# Patient Record
Sex: Male | Born: 1980 | Race: White | Hispanic: No | Smoking: Former smoker
Health system: Southern US, Community
[De-identification: ages and names within clinical notes are randomized; demographics above are authoritative.]

## PROBLEM LIST (undated history)

## (undated) DIAGNOSIS — F329 Major depressive disorder, single episode, unspecified: Secondary | ICD-10-CM

## (undated) DIAGNOSIS — F32A Depression, unspecified: Secondary | ICD-10-CM

## (undated) DIAGNOSIS — T7840XA Allergy, unspecified, initial encounter: Secondary | ICD-10-CM

## (undated) HISTORY — PX: HERNIA REPAIR: SHX51

## (undated) HISTORY — DX: Depression, unspecified: F32.A

## (undated) HISTORY — DX: Major depressive disorder, single episode, unspecified: F32.9

## (undated) HISTORY — PX: RHINOPLASTY: SUR1284

## (undated) HISTORY — DX: Allergy, unspecified, initial encounter: T78.40XA

---

## 2006-11-29 ENCOUNTER — Ambulatory Visit: Payer: Self-pay | Admitting: Family Medicine

## 2006-11-29 DIAGNOSIS — F3289 Other specified depressive episodes: Secondary | ICD-10-CM | POA: Insufficient documentation

## 2006-11-29 DIAGNOSIS — F329 Major depressive disorder, single episode, unspecified: Secondary | ICD-10-CM | POA: Insufficient documentation

## 2006-12-27 ENCOUNTER — Ambulatory Visit: Payer: Self-pay | Admitting: Family Medicine

## 2007-01-04 ENCOUNTER — Encounter: Payer: Self-pay | Admitting: Family Medicine

## 2007-01-04 LAB — CONVERTED CEMR LAB
Albumin: 4.8 g/dL (ref 3.5–5.2)
BUN: 16 mg/dL (ref 6–23)
Calcium: 9.3 mg/dL (ref 8.4–10.5)
Chloride: 104 meq/L (ref 96–112)
Glucose, Bld: 96 mg/dL (ref 70–99)
HDL: 48 mg/dL (ref >39)
Potassium: 4 meq/L (ref 3.5–5.3)
Triglycerides: 126 mg/dL (ref <150)

## 2007-01-05 ENCOUNTER — Encounter: Payer: Self-pay | Admitting: Family Medicine

## 2007-05-27 ENCOUNTER — Ambulatory Visit: Payer: Self-pay | Admitting: Family Medicine

## 2007-09-20 ENCOUNTER — Ambulatory Visit: Payer: Self-pay | Admitting: Family Medicine

## 2007-09-20 DIAGNOSIS — G4700 Insomnia, unspecified: Secondary | ICD-10-CM | POA: Insufficient documentation

## 2007-10-12 ENCOUNTER — Telehealth: Payer: Self-pay | Admitting: Family Medicine

## 2007-10-12 ENCOUNTER — Ambulatory Visit: Payer: Self-pay | Admitting: Family Medicine

## 2007-10-12 ENCOUNTER — Encounter: Admission: RE | Admit: 2007-10-12 | Discharge: 2007-10-12 | Payer: Self-pay | Admitting: Family Medicine

## 2007-10-12 DIAGNOSIS — M25539 Pain in unspecified wrist: Secondary | ICD-10-CM

## 2007-12-02 ENCOUNTER — Ambulatory Visit: Payer: Self-pay | Admitting: Family Medicine

## 2007-12-02 DIAGNOSIS — F4322 Adjustment disorder with anxiety: Secondary | ICD-10-CM

## 2008-01-11 ENCOUNTER — Ambulatory Visit: Payer: Self-pay | Admitting: Family Medicine

## 2008-01-11 DIAGNOSIS — J45909 Unspecified asthma, uncomplicated: Secondary | ICD-10-CM | POA: Insufficient documentation

## 2008-01-11 DIAGNOSIS — J069 Acute upper respiratory infection, unspecified: Secondary | ICD-10-CM | POA: Insufficient documentation

## 2008-02-23 ENCOUNTER — Ambulatory Visit: Payer: Self-pay | Admitting: Family Medicine

## 2008-02-23 DIAGNOSIS — K5289 Other specified noninfective gastroenteritis and colitis: Secondary | ICD-10-CM | POA: Insufficient documentation

## 2008-06-22 ENCOUNTER — Ambulatory Visit: Payer: Self-pay | Admitting: Family Medicine

## 2008-06-22 DIAGNOSIS — R5383 Other fatigue: Secondary | ICD-10-CM

## 2008-06-22 DIAGNOSIS — R5381 Other malaise: Secondary | ICD-10-CM

## 2008-06-26 LAB — CONVERTED CEMR LAB
Basophils Absolute: 0 10*3/uL (ref 0.0–0.1)
Eosinophils Absolute: 0 10*3/uL (ref 0.0–0.7)
Eosinophils Relative: 0 % (ref 0–5)
HCT: 42.9 % (ref 39.0–52.0)
Lymphs Abs: 1.6 10*3/uL (ref 0.7–4.0)
MCV: 89.7 fL (ref 78.0–100.0)
Platelets: 203 10*3/uL (ref 150–400)
RDW: 13.4 % (ref 11.5–15.5)

## 2008-08-02 ENCOUNTER — Ambulatory Visit: Payer: Self-pay | Admitting: Family Medicine

## 2008-08-02 ENCOUNTER — Encounter: Admission: RE | Admit: 2008-08-02 | Discharge: 2008-08-02 | Payer: Self-pay | Admitting: Family Medicine

## 2008-08-02 DIAGNOSIS — L608 Other nail disorders: Secondary | ICD-10-CM

## 2008-08-02 DIAGNOSIS — M79609 Pain in unspecified limb: Secondary | ICD-10-CM | POA: Insufficient documentation

## 2008-08-05 ENCOUNTER — Telehealth: Payer: Self-pay | Admitting: Family Medicine

## 2008-09-07 ENCOUNTER — Ambulatory Visit: Payer: Self-pay | Admitting: Family Medicine

## 2008-09-07 DIAGNOSIS — K469 Unspecified abdominal hernia without obstruction or gangrene: Secondary | ICD-10-CM | POA: Insufficient documentation

## 2008-09-11 ENCOUNTER — Ambulatory Visit: Payer: Self-pay | Admitting: Family Medicine

## 2008-09-11 DIAGNOSIS — M531 Cervicobrachial syndrome: Secondary | ICD-10-CM

## 2008-09-12 ENCOUNTER — Telehealth: Payer: Self-pay | Admitting: Family Medicine

## 2008-09-18 ENCOUNTER — Encounter: Payer: Self-pay | Admitting: Family Medicine

## 2008-10-24 ENCOUNTER — Ambulatory Visit: Payer: Self-pay | Admitting: Family Medicine

## 2008-10-24 DIAGNOSIS — J019 Acute sinusitis, unspecified: Secondary | ICD-10-CM

## 2008-10-29 ENCOUNTER — Encounter: Payer: Self-pay | Admitting: Family Medicine

## 2008-11-21 ENCOUNTER — Encounter: Payer: Self-pay | Admitting: Family Medicine

## 2009-02-04 ENCOUNTER — Ambulatory Visit: Payer: Self-pay | Admitting: Family Medicine

## 2009-04-25 ENCOUNTER — Encounter: Payer: Self-pay | Admitting: Family Medicine

## 2009-05-09 ENCOUNTER — Ambulatory Visit: Payer: Self-pay | Admitting: Family Medicine

## 2009-12-17 ENCOUNTER — Ambulatory Visit: Payer: Self-pay | Admitting: Family Medicine

## 2010-02-12 ENCOUNTER — Ambulatory Visit: Payer: Self-pay | Admitting: Family Medicine

## 2010-02-12 DIAGNOSIS — M65849 Other synovitis and tenosynovitis, unspecified hand: Secondary | ICD-10-CM

## 2010-02-12 DIAGNOSIS — M65839 Other synovitis and tenosynovitis, unspecified forearm: Secondary | ICD-10-CM

## 2010-02-27 ENCOUNTER — Encounter: Payer: Self-pay | Admitting: Family Medicine

## 2010-04-15 NOTE — Letter (Signed)
Summary: [facility]   Imported By: Lanelle Bal 05/16/2009 10:29:27  _____________________________________________________________________  External Attachment:    Type:   Image     Comment:   External Document

## 2010-04-15 NOTE — Assessment & Plan Note (Signed)
Summary: gastroenteritis   Vital Signs:  Patient profile:   30 year old male Height:      66.25 inches Weight:      140 pounds BMI:     22.51 O2 Sat:      96 % on Room air Pulse rate:   78 / minute BP sitting:   122 / 65  (left arm) Cuff size:   regular  Vitals Entered By: Payton Spark CMA (February 12, 2010 2:55 PM)  O2 Flow:  Room air CC: Weak and diarrhea x 3 days. Also c/o B wrist pain due to job   Primary Care Provider:  Seymour Bars DO  CC:  Weak and diarrhea x 3 days. Also c/o B wrist pain due to job.  History of Present Illness: 30 yo WM presents for GI upset that started Sat night.  He had N/V/ abd pain Sat night and all day Sunday with a low grade fever.  He did not go back to work on Monday.    He still has some lose stools but only 1-2 x a day.  He has not had any more cramping or pain today.  No longer having fevers.  Did not have blood in his stool.  No one else got sick that night.  he had eaten chicken at [employer].  He took some Pepto this AM.  He is making himself drink more today.  Drinking water today.  Still feels a bit  weak.  No longer having N/V.    Current Medications (verified): 1)  Zolpidem Tartrate 5 Mg Tabs (Zolpidem Tartrate) .Marland Kitchen.. 1 Tab By Mouth At Bedtime As Needed Sleep  Allergies (verified): No Known Drug Allergies  Past History:  Past Medical History: Reviewed history from 12/17/2009 and no changes required. depression since 2005 mild intermitent asthma allergies  Social History: Reviewed history from 11/29/2006 and no changes required. 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 Never smoked.  Denies drug use. 1 ETOH per month. Fair diet and rides bike 2 x a wk.  Review of Systems      See HPI  Physical Exam  General:  alert, well-developed, well-nourished, and well-hydrated.   Head:  normocephalic and atraumatic.   Eyes:  sclera non icteric Mouth:  good dentition and pharynx  pink and moist.   Neck:  no masses.   Lungs:  Normal respiratory effort, chest expands symmetrically. Lungs are clear to auscultation, no crackles or wheezes. Heart:  Normal rate and regular rhythm. S1 and S2 normal without gallop, murmur, click, rub or other extra sounds. Abdomen:  soft, non-tender, normal bowel sounds, no distention, no masses, no guarding, no hepatomegaly, and no splenomegaly.   Msk:  tender over the L 1st MCP joint , no effusion, full wrist ROM Skin:  color normal.  no pallor or jaundice Cervical Nodes:  No lymphadenopathy noted   Impression & Recommendations:  Problem # 1:  GASTROENTERITIS (ICD-558.9) Assessment Improved Acute Viral gastroenteritis, resolving after 4 days.  Weakness due to mild dehydration.  Recommend rehydration with diluted gatorade, avoidance of milk products, Kaopectate for diarrhea and Zofran for nausea.  If not completely resolved by Monday, please call. His updated medication list for this problem includes:    Zofran Odt 8 Mg Tbdp (Ondansetron) .Marland Kitchen... 1 tab by mouth sl q 8 hrs as needed nausea  Problem # 2:  TENDINITIS, WRIST (ICD-727.05) Seconday to recurrent lifting at work.  He has been using  an OTC brace but it's not helping.  I did recommend seeing ortho given his occupation.  Complete Medication List: 1)  Zolpidem Tartrate 5 Mg Tabs (Zolpidem tartrate) .Marland Kitchen.. 1 tab by mouth at bedtime as needed sleep 2)  Zofran Odt 8 Mg Tbdp (Ondansetron) .Marland Kitchen.. 1 tab by mouth sl q 8 hrs as needed nausea  Patient Instructions: 1)  Drink diluted gatorade to rehydrate today and tomorrow. 2)  Avoid any milk products for a full week. 3)  Zofran RX given for nausea to use as needed. 4)  Call when you are ready to see sports med for wrist tendonitis. Prescriptions: ZOFRAN ODT 8 MG TBDP (ONDANSETRON) 1 tab by mouth SL q 8 hrs as needed nausea  #15 x 0   Entered and Authorized by:   Seymour Bars DO   Signed by:   Seymour Bars DO on 02/12/2010   Method used:    Electronically to        [facility] [phone]* (retail)       [address]       [place], [place]  [address]       Ph: [phone]       Fax: [phone]   RxID:   [id number]    Orders Added: 1)  Est. Patient Level III [36644]

## 2010-04-15 NOTE — Assessment & Plan Note (Signed)
Summary: insomnia   Vital Signs:  Patient profile:   30 year old male Height:      66.25 inches Weight:      143 pounds BMI:     22.99 O2 Sat:      96 % on Room air Pulse rate:   61 / minute BP sitting:   136 / 64  (left arm) Cuff size:   regular  Vitals Entered By: Payton Spark CMA (December 17, 2009 2:36 PM)  O2 Flow:  Room air CC: Still not sleeping well- hydroxyzine did not work well. Also c/o congestion and drainage x 4 days.   Primary Care Provider:  Seymour Bars DO  CC:  Still not sleeping well- hydroxyzine did not work well. Also c/o congestion and drainage x 4 days.Marland Kitchen  History of Present Illness: 30 yo WM presents for continued problems with poor sleep.   I tried him on Hydroxyzine earlier this year that did work.  He switched to Benadryl after that.  He has a hard time sleeping 2-3 x a wk.  His trouble is falling asleep.  Denies any acute stressors or racing thoughts.  He is trying to unwind before bed.  He drinks caffeine day and night.  He has not been exercising as much.  He has been busy at work.  Denies any further problems with depression or anxiety.  Mountain biking for fun.        Current Medications (verified): 1)  None  Allergies (verified): No Known Drug Allergies  Past History:  Past Medical History: depression since 2005 mild intermitent asthma allergies  Past Surgical History: Reviewed history from 05/09/2009 and no changes required. rhinoplasty x 2 bilat hernia surgery Dr Gerrit Friends 2010-2011  Family History: Reviewed history from 11/29/2006 and no changes required. mother and father healthy sister depression grandmother bipolar d/o  Social History: Reviewed history from 11/29/2006 and no changes required. 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 Never smoked.  Denies drug use. 1 ETOH per month. Fair diet and rides bike 2 x a wk.  Review of Systems      See HPI  Physical  Exam  General:  alert, well-developed, well-nourished, and well-hydrated.   Head:  normocephalic and atraumatic.   Mouth:  pharynx pink and moist.  cobblestoning Neck:  no masses.   Lungs:  Normal respiratory effort, chest expands symmetrically. Lungs are clear to auscultation, no crackles or wheezes. Heart:  Normal rate and regular rhythm. S1 and S2 normal without gallop, murmur, click, rub or other extra sounds. Skin:  color normal.   Psych:  good eye contact, not anxious appearing, and not depressed appearing.     Impression & Recommendations:  Problem # 1:  INSOMNIA, TRANSIENT (ICD-780.52) Chronic.    Hydroxyzine did not work.  Will change him to generic Ambien as needed.  Allow 8 hrs to sleep after taking.  Call if any problems.  Work on stress redudciton and more regular exercise.  Cut out caffeine in the evenings.   His updated medication list for this problem includes:    Zolpidem Tartrate 5 Mg Tabs (Zolpidem tartrate) .Marland Kitchen... 1 tab by mouth at bedtime as needed sleep  Complete Medication List: 1)  Zolpidem Tartrate 5 Mg Tabs (Zolpidem tartrate) .Marland Kitchen.. 1 tab by mouth at bedtime as needed sleep  Patient Instructions: 1)  Use Claritin or Zyrtec as needed for seasonal allergies. 2)  Use Zolpidem (generic Ambien) at bedtime as  needed for sleep. 3)  Allow 8 hrs to sleep after taking. 4)  Call if any problems. 5)  Return for follow up as needed. Prescriptions: ZOLPIDEM TARTRATE 5 MG TABS (ZOLPIDEM TARTRATE) 1 tab by mouth at bedtime as needed sleep  #24 x 2   Entered and Authorized by:   Seymour Bars DO   Signed by:   Seymour Bars DO on 12/17/2009   Method used:   Printed then faxed to ...       [facility] [phone]* (retail)       [address]       [place], [place]  [address]       Ph: [phone]       Fax: [phone]   RxID:   [id number]

## 2010-04-15 NOTE — Letter (Signed)
Summary: Out of Work  [facility]  [address]   [place], [place] [address]   Phone: [phone]  Fax: [phone]    February 12, 2010   Employee:  ILEY BREEDEN    To Whom It May Concern:   For Medical reasons, please excuse the above named employee from work for the following dates:  Start:   Nov 28th  End:   Nov 29th  If you need additional information, please feel free to contact our office.         Sincerely,    Seymour Bars DO

## 2010-04-15 NOTE — Letter (Signed)
Summary: Depression Questionnaire/[name]  Depression Questionnaire/ Gary Doyle   Imported By: Lanelle Bal 05/15/2009 08:32:12  _____________________________________________________________________  External Attachment:    Type:   Image     Comment:   External Document

## 2010-04-15 NOTE — Assessment & Plan Note (Signed)
Summary: sleep/ mood   Vital Signs:  Patient profile:   30 year old male Height:      66.25 inches Weight:      142 pounds BMI:     22.83 O2 Sat:      100 % on Room air Pulse rate:   76 / minute BP sitting:   139 / 76  (right arm) Cuff size:   regular  Vitals Entered By: Payton Spark CMA (May 09, 2009 1:46 PM)  O2 Flow:  Room air CC: Trouble sleeping. Denies stress and anxiety   Primary Care Provider:  Seymour Bars DO  CC:  Trouble sleeping. Denies stress and anxiety.  History of Present Illness: Gary Doyle is a 29 year old male with h/o depression presenting with trouble sleeping. 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 About 2-3 months ago he again began feeling symptoms of depressed mood. He saw his flight doctor 2 weeks ago who recommended a supplement called Sam-E Complete which has helped his mood.  He is taking this in the AM.  Around the same time he has had trouble falling asleep. He gets in bed 9:30/10:00 and is unable to get to sleep for  ~2 hours. Doesn't feel nervous, no racing thoughts; he has been out of work x1 month due to hernia surgery so he is not stressed. Once he falls asleep, he is able to stay asleep until he wakes up between 7:30 and 9:30. No naps during the day, feels pretty awake throughout the day. Drinks 1 cup of coffee/morning. Normally mountain bikes and runs for exercise but has not been since his surgery. No suicidal ideation. He has taken Ambien in the past which has worked for him.   Current Medications (verified): 1)  Ambien 10 Mg  Tabs (Zolpidem Tartrate) .Marland Kitchen.. 1 Tab By Mouth At Bedtime As Needed Sleep 2)  Calcium  Allergies (verified): No Known Drug Allergies  Past History:  Past Medical History: Reviewed history from 01/11/2008 and no changes required. depression since 2005 mild intermitent asthma  Past Surgical  History: rhinoplasty x 2 bilat hernia surgery Dr Gerrit Friends 2010-2011  Social History: Reviewed history from 11/29/2006 and no changes required. 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 Never smoked.  Denies drug use. 1 ETOH per month. Fair diet and rides bike 2 x a wk.  Review of Systems Psych:  Complains of anxiety and depression; denies alternate hallucination ( auditory/visual), irritability, suicidal thoughts/plans, and thoughts of violence.  Physical Exam  General:  WDWN male in no acute distress.  Head:  Normocephalic and atraumatic.  Neck:  no masses.   Lungs:  Clear to auscultation bilaterally. No wheezes, rales, or rhonchi.  Heart:  RRR, normal S1 and S2. No murmur, rub, or gallop.  Neurologic:  Alert and oriented.  Skin:  color normal.   Cervical Nodes:  No lymphadenopathy noted Psych:  Interacting appropriately with normal attention and concentration.    Impression & Recommendations:  Problem # 1:  INSOMNIA, TRANSIENT (ICD-780.52) Secondary to depression/ anxiety.  Will try some Hydroxyzine for now.  Once he is back to regular exercise after healing from his hernia surgery, he may no longer need this.  His updated medication list for this problem includes:    Hydroxyzine Hcl 50 Mg Tabs (Hydroxyzine hcl) .Marland Kitchen... 1 tab by mouth at bedtime  as needed sleep  Problem # 2:  DEPRESSION, MILD (ICD-311) Scored 8 on PHQ-9, consistent with mild depression. He is resistant to RX treatment due to [employer] rules.  He is OK to continue on current OTC supplement and understands that by not treating with RX meds or counseling that his mood may worsen.  He is to call if any worsening in his symptoms.   His updated medication list for this problem includes:    Hydroxyzine Hcl 50 Mg Tabs (Hydroxyzine hcl) .Marland Kitchen... 1 tab by mouth at bedtime as needed sleep  Complete Medication List: 1)  Calcium  2)  Hydroxyzine Hcl 50 Mg Tabs (Hydroxyzine hcl) .Marland Kitchen..  1 tab by mouth at bedtime as needed sleep  Patient Instructions: 1)  Return for f/u mood in 3 months, sooner if needed.   Prescriptions: HYDROXYZINE HCL 50 MG TABS (HYDROXYZINE HCL) 1 tab by mouth at bedtime as needed sleep  #30 x 1   Entered and Authorized by:   Gary Bars DO   Signed by:   Gary Bars DO on 05/09/2009   Method used:   Electronically to        [facility] [phone]* (retail)       [address]       [place], [place]  [address]       Ph: [phone]       Fax: [phone]   RxID:   [id number]

## 2010-04-17 NOTE — Letter (Signed)
Summary: [facility]   Imported By: Lanelle Bal 03/24/2010 11:35:19  _____________________________________________________________________  External Attachment:    Type:   Image     Comment:   External Document

## 2010-05-26 ENCOUNTER — Ambulatory Visit (INDEPENDENT_AMBULATORY_CARE_PROVIDER_SITE_OTHER): Payer: PRIVATE HEALTH INSURANCE | Admitting: Family Medicine

## 2010-05-26 ENCOUNTER — Encounter: Payer: Self-pay | Admitting: Family Medicine

## 2010-05-26 DIAGNOSIS — R109 Unspecified abdominal pain: Secondary | ICD-10-CM

## 2010-05-26 DIAGNOSIS — K921 Melena: Secondary | ICD-10-CM

## 2010-05-27 ENCOUNTER — Encounter: Payer: Self-pay | Admitting: Family Medicine

## 2010-05-27 LAB — CONVERTED CEMR LAB
AST: 19 units/L (ref 0–37)
Alkaline Phosphatase: 65 units/L (ref 39–117)
BUN: 17 mg/dL (ref 6–23)
Basophils Relative: 0 % (ref 0–1)
Creatinine, Ser: 1.01 mg/dL (ref 0.40–1.50)
Eosinophils Absolute: 0.1 10*3/uL (ref 0.0–0.7)
Eosinophils Relative: 1 % (ref 0–5)
HCT: 47.4 % (ref 39.0–52.0)
MCHC: 32.3 g/dL (ref 30.0–36.0)
MCV: 91.5 fL (ref 78.0–100.0)
Monocytes Absolute: 0.5 10*3/uL (ref 0.1–1.0)
Monocytes Relative: 6 % (ref 3–12)
Neutrophils Relative %: 74 % (ref 43–77)
Potassium: 4.3 meq/L (ref 3.5–5.3)
RBC: 5.18 M/uL (ref 4.22–5.81)
Total Bilirubin: 0.5 mg/dL (ref 0.3–1.2)

## 2010-06-03 NOTE — Assessment & Plan Note (Signed)
Summary: blood in stool   Vital Signs:  Patient profile:   29 year old male Height:      66.25 inches Weight:      146 pounds BMI:     23.47 O2 Sat:      99 % on Room air Temp:     98.9 degrees F oral Pulse rate:   74 / minute BP sitting:   128 / 72  (left arm)  Vitals Entered By: Payton Spark CMA (May 26, 2010 8:59 AM)  O2 Flow:  Room air CC: Blood in stool this AM. Also c/o abd cramping   Primary Care Provider:  Seymour Bars DO  CC:  Blood in stool this AM. Also c/o abd cramping.  History of Present Illness: 30 year old male presents with blood in his stool.  He has been having stomach cramps for the past three days which became worse this morning.  This morning the cramps became worse and he tried to go to the bathroom at which time he noticed blood in his stool.  He describes the stool as being darker red around the stool as well as blood on the toilet paper when wiping.  He does feel as though he may have been straining more this morning than normal.  The cramping in his abdomen is intermittent and is in the middle of his lower stomach, not concentrated in either lower quadrant.  It does occur more often after he eats.  He did have some nausea the past two days and has a slightly decreased appetite but denies any change in his bowel movements, vomiting, fever, weight loss or weight gain.  He tried Immodium but this did not alleviate his pain.  He denies any changes in urine color or urinary symptoms, recent travel or camping.  Patient with no family history of colitis or colon cancer.  Allergies: No Known Drug Allergies  Past History:  Past Medical History: Reviewed history from 12/17/2009 and no changes required. depression since 2005 mild intermitent asthma allergies  Past Surgical History: Reviewed history from 05/09/2009 and no changes required. rhinoplasty x 2 bilat hernia surgery Dr Gerrit Friends 2010-2011  Social History: Reviewed history from 11/29/2006 and no  changes required. 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 Never smoked.  Denies drug use. 1 ETOH per month. Fair diet and rides bike 2 x a wk.  Review of Systems      See HPI  Physical Exam  General:  alert, well-developed, and well-nourished.   Head:  normocephalic and atraumatic.   Eyes:  sclera non icteric, no conjunctival pallor Mouth:  Oropharynx pink and moist without exudate. Lungs:  normal respiratory effort, normal breath sounds, no crackles, and no wheezes.   Heart:  normal rate, regular rhythm, no murmur, no gallop, and no rub.   Abdomen:  soft, non-tender, normal bowel sounds, and no distention.  no R/G/R Rectal:  no external abnormalities, no masses, no fissures, no fistulae, no perianal rash, and stool positive for occult blood.   Prostate:  no gland enlargement, no nodules, and no asymmetry.   Pulses:  R radial normal and L radial normal.   Skin:  no pallor or rash Cervical Nodes:  no anterior cervical adenopathy and no posterior cervical adenopathy.     Impression & Recommendations:  Problem # 1:  HEMATOCHEZIA (ICD-578.1) Assessment New  Patient with blood in his stool this morning.  Positive for occult stool in the  office.   Possible viral vs. internal hemorrhoids.  Will check CBC and CMP today as well as refer to GI.  Patient aware that if he develops worsening symptoms including fever he is to call me back.  Orders: Gastroenterology Referral (GI) Hemoccult Guaiac-1 spec.(in office) (82270)  Problem # 2:  ABDOMINAL CRAMPS (ICD-789.00) Assessment: New  Patient with abdominal cramps intermittently for the past three days.  LIkely viral vs. constipation.  Will prescribe bentyl to use as needed for his cramping.  He can take this until he sees GI.  Orders: T-CBC w/Diff ([id number]) T-Comprehensive Metabolic Panel [phone]) Gastroenterology Referral (GI)  Complete Medication List: 1)  Zolpidem Tartrate  5 Mg Tabs (Zolpidem tartrate) .Marland Kitchen.. 1 tab by mouth at bedtime as needed sleep 2)  Zofran Odt 8 Mg Tbdp (Ondansetron) .Marland Kitchen.. 1 tab by mouth sl q 8 hrs as needed nausea 3)  Bentyl 20 Mg Tabs (Dicyclomine hcl) .Marland Kitchen.. 1 tab by mouth three times a day as needed abdominal cramping  Patient Instructions: 1)  Bland diet with plenty of clear fluids today and tomorrow. 2)  Call if any worsening symptoms. 3)  Labs today. 4)  Will call you w/ results tomorrow. 5)  Use Bentyl as needed for cramping. 6)  Will schedule you with GI to follow. Prescriptions: BENTYL 20 MG TABS (DICYCLOMINE HCL) 1 tab by mouth three times a day as needed abdominal cramping  #30 x 0   Entered and Authorized by:   Seymour Bars DO   Signed by:   Seymour Bars DO on 05/26/2010   Method used:   Electronically to        [facility] [phone]* (retail)       [address]       [place], [place]  [address]       Ph: [phone]       Fax: [phone]   RxID:   [id number]    Orders Added: 1)  T-CBC w/Diff [[id number]] 2)  T-Comprehensive Metabolic Panel [[id number]] 3)  Gastroenterology Referral [GI] 4)  Est. Patient Level IV [46270] 5)  Hemoccult Guaiac-1 spec.(in office) [82270]

## 2010-06-03 NOTE — Letter (Signed)
Summary: Out of Work  [facility]  [address]   [place], [place] [address]   Phone: [phone]  Fax: [phone]    May 26, 2010   Employee:  Gary Doyle    To Whom It May Concern:   For Medical reasons, please excuse the above named employee from work for the following dates:  Start:   March 12th  End:   March 13th  If you need additional information, please feel free to contact our office.         Sincerely,    Seymour Bars DO

## 2010-10-13 ENCOUNTER — Telehealth: Payer: Self-pay | Admitting: Family Medicine

## 2010-10-13 NOTE — Telephone Encounter (Signed)
Pt called and had experienced LT eye flashing to the point it overtook his vision this PM.  Family history of detached retina.  Not a diabetic.   Plan:  Offered pt appt in the morning and pt will call back if decides to do that, but as of now he would prefer to go to the UC tonight.  Told pt that would be fine. Jarvis Newcomer, LPN [facility]

## 2010-10-14 ENCOUNTER — Ambulatory Visit (INDEPENDENT_AMBULATORY_CARE_PROVIDER_SITE_OTHER): Payer: PRIVATE HEALTH INSURANCE | Admitting: Family Medicine

## 2010-10-14 ENCOUNTER — Encounter: Payer: Self-pay | Admitting: Family Medicine

## 2010-10-14 VITALS — BP 121/68 | HR 68 | Wt 141.0 lb

## 2010-10-14 DIAGNOSIS — G43109 Migraine with aura, not intractable, without status migrainosus: Secondary | ICD-10-CM

## 2010-10-14 DIAGNOSIS — G43809 Other migraine, not intractable, without status migrainosus: Secondary | ICD-10-CM

## 2010-10-14 NOTE — Patient Instructions (Signed)
For ocular migraines:  Cut back on caffeine, stay well hydrates and avoid extreme heat.  If this recurs, call you eye doctor.    Rest in a dark room until symptoms subside.   Let us know if this is occuring frequently.

## 2010-10-14 NOTE — Assessment & Plan Note (Signed)
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  Explained to pt what this means.  Heat and anxiety from recent interview likely to be precipitating factors.  Advised him to stay well hydrated and he was told to call the eye dr if they returned.

## 2010-10-14 NOTE — Progress Notes (Signed)
  Subjective:    Patient ID: Gary Doyle, male    DOB: February 23, 1981, 30 y.o.   MRN: [id number]  HPI  30 yo WM presents for seeing wavey lines last night while leaving work.  Has never had this before  He was trying to drive and he then started to see flashes of light from his L eye.  He got home and ate something.  He did not have a HA.  This lasted about 25 min and it went away on its own.  No N/V.  He went to the eye doctor yesterday and had a normal exam.  Has fam hx of migraines.  Works in the heat.  Denies recent heavy exertion or diarrhea.  He was stressed out about an interview yesterday.  BP 121/68  Pulse 68  Wt 141 lb (63.957 kg)  SpO2 98%   Review of Systems  Eyes: Positive for visual disturbance. Negative for photophobia and pain.  Respiratory: Negative for shortness of breath.   Cardiovascular: Negative for chest pain and palpitations.  Neurological: Negative for dizziness, tremors, speech difficulty, light-headedness, numbness and headaches.       Objective:   Physical Exam  Constitutional: He appears well-developed and well-nourished.  Eyes: EOM are normal. Pupils are equal, round, and reactive to light.  Neck: No thyromegaly present.  Cardiovascular: Normal rate and normal heart sounds.   Pulmonary/Chest: Effort normal and breath sounds normal.  Psychiatric: He has a normal mood and affect.          Assessment & Plan:   No problem-specific assessment & plan notes found for this encounter.

## 2010-11-10 IMAGING — CR DG FOOT 2V*R*
2 series · 2 of 2 positions shown · non-contrast
Comparison: None

CLINICAL DATA: Injured 2nd toe.

RIGHT FOOT - 2 VIEW

[view not recorded (1 of 2)]
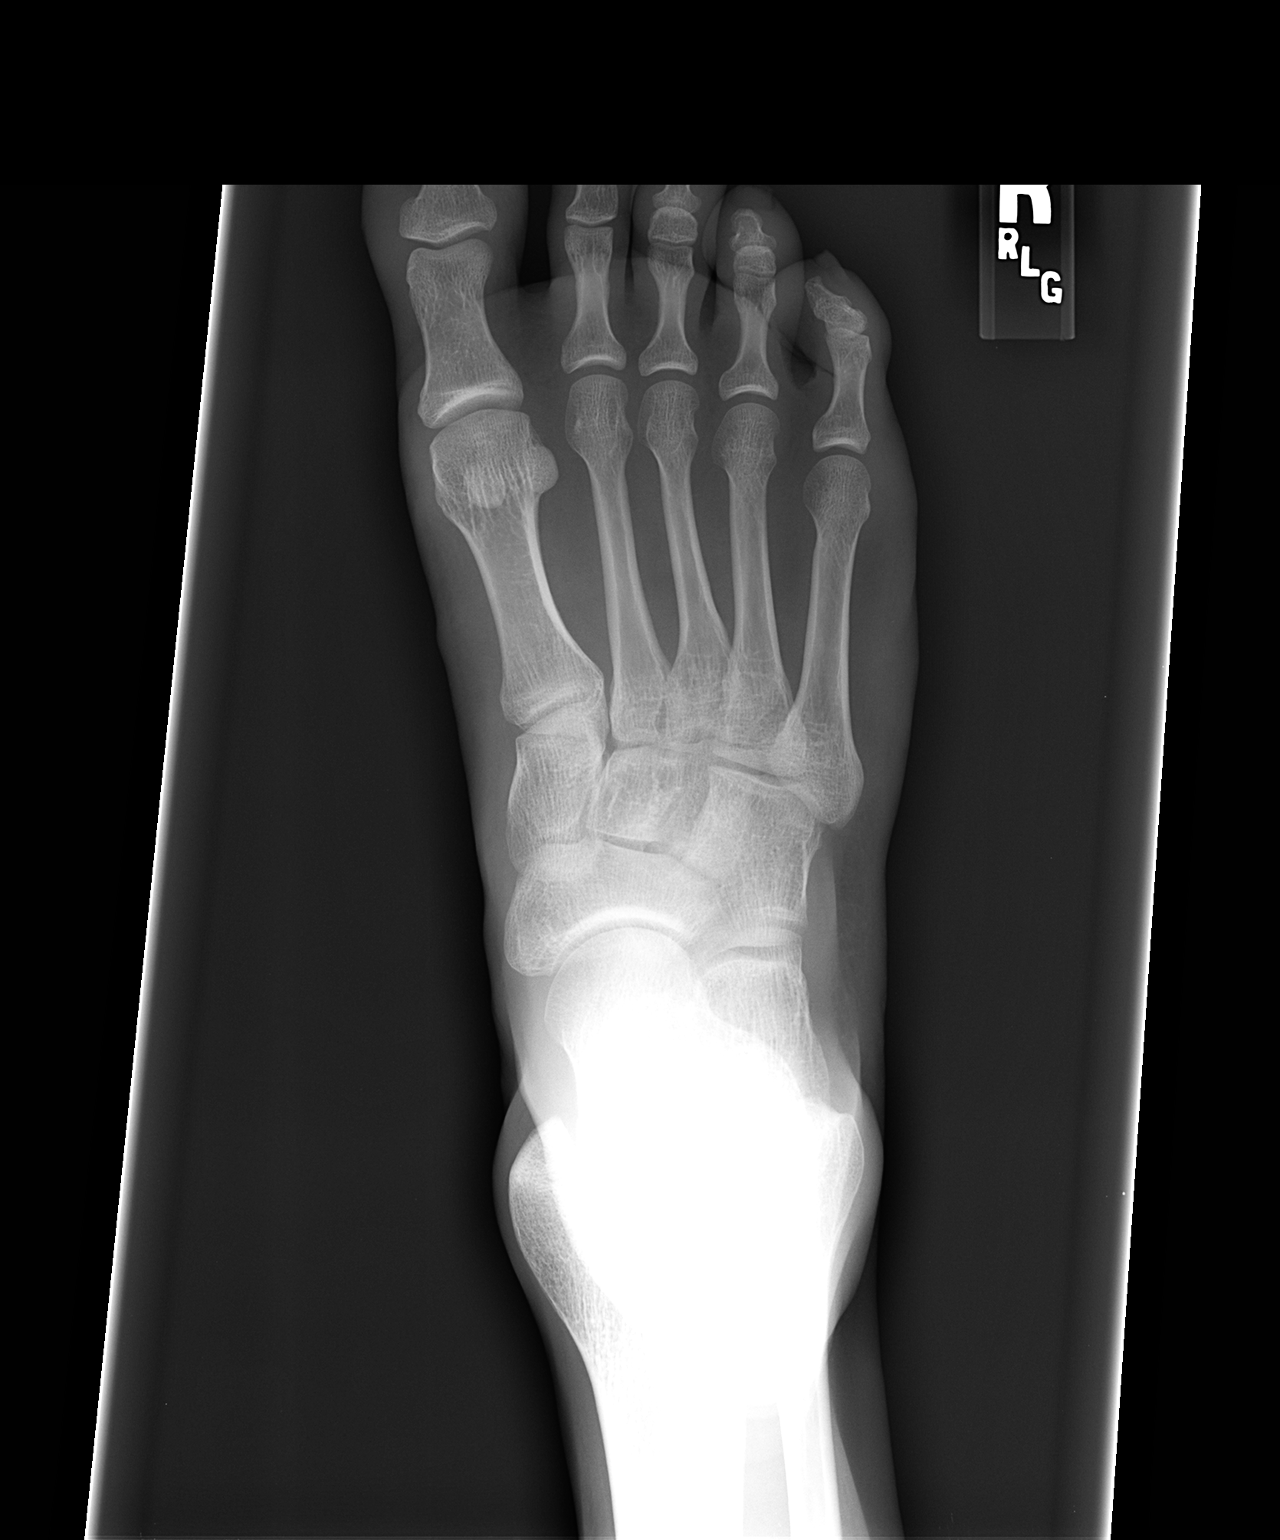

[view not recorded (2 of 2)]
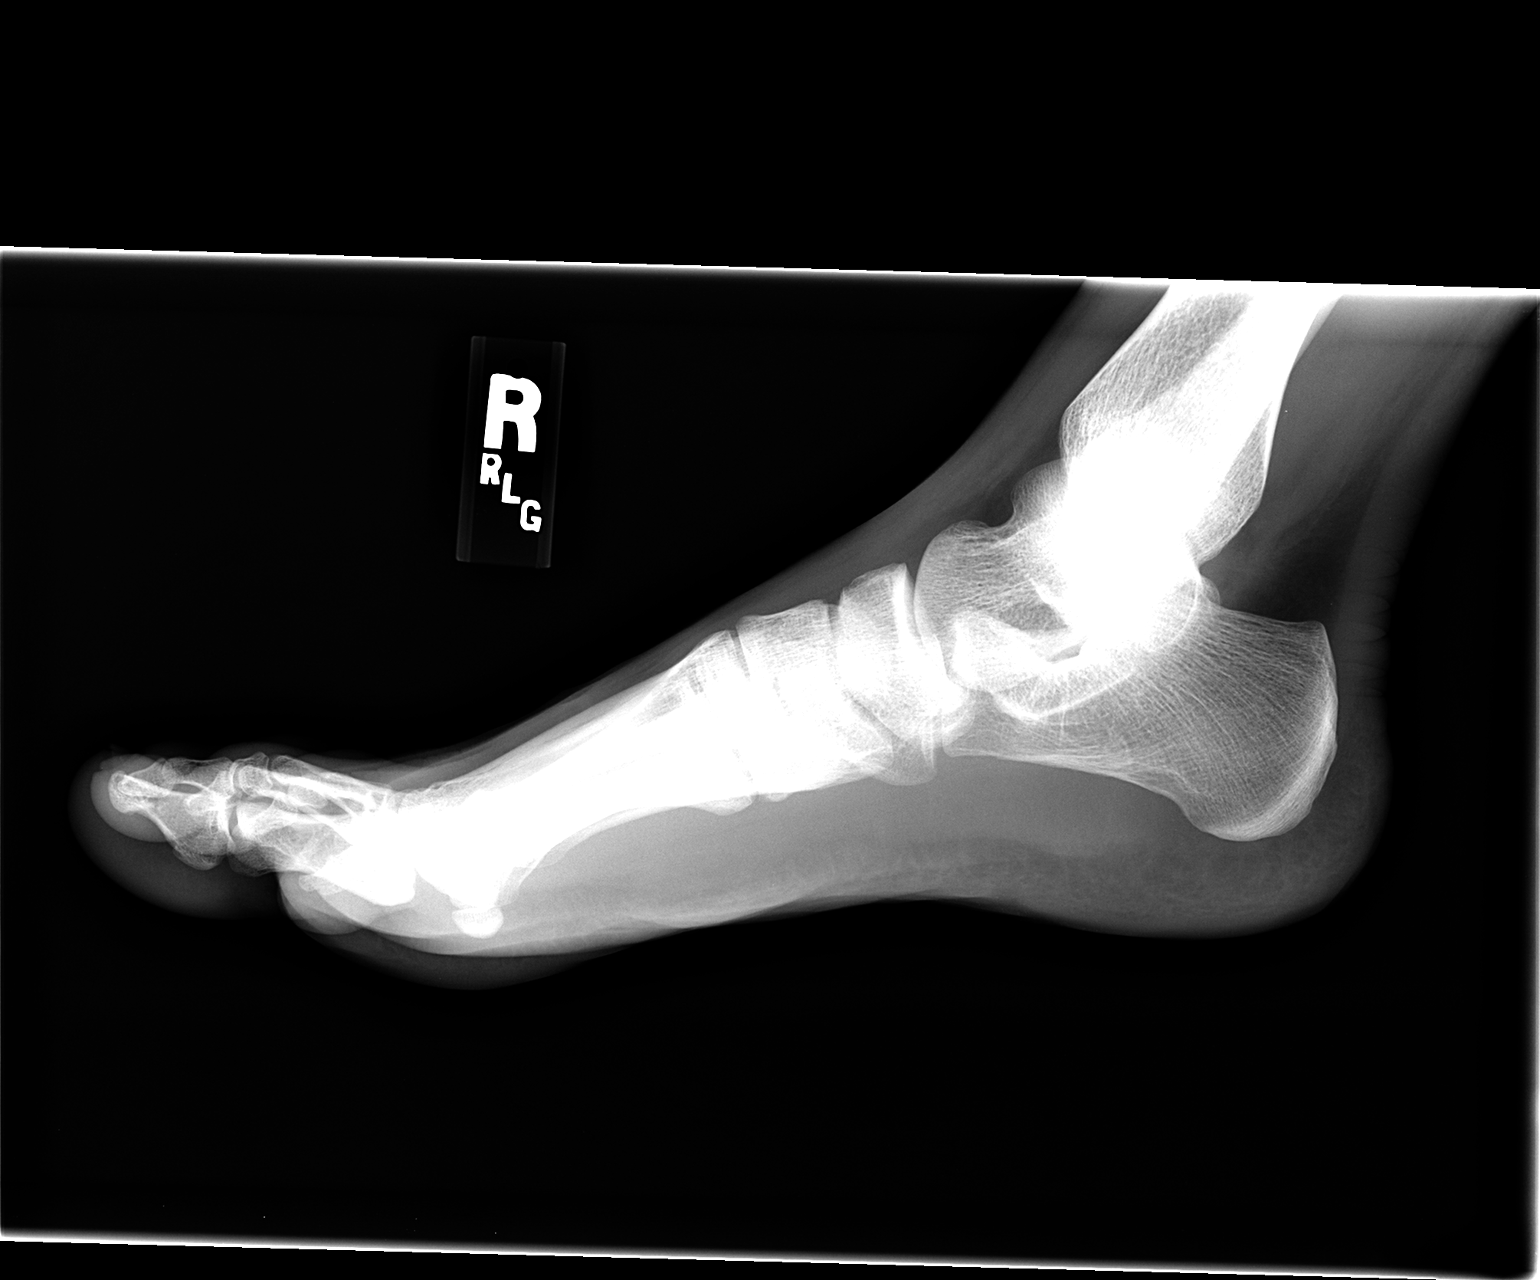

[2 of 2 positions shown; findings below may reference images not displayed]

FINDINGS: The joint spaces are maintained.  No fractures are seen.
IMPRESSION: No acute bony findings.

## 2010-11-25 ENCOUNTER — Inpatient Hospital Stay (INDEPENDENT_AMBULATORY_CARE_PROVIDER_SITE_OTHER)
Admission: RE | Admit: 2010-11-25 | Discharge: 2010-11-25 | Disposition: A | Discharge status: Home / Self Care | Payer: PRIVATE HEALTH INSURANCE | Source: Ambulatory Visit | Attending: Family Medicine | Admitting: Family Medicine

## 2010-11-25 ENCOUNTER — Encounter: Payer: Self-pay | Admitting: Family Medicine

## 2010-11-25 DIAGNOSIS — J9801 Acute bronchospasm: Secondary | ICD-10-CM

## 2010-11-25 DIAGNOSIS — J069 Acute upper respiratory infection, unspecified: Secondary | ICD-10-CM

## 2010-11-27 ENCOUNTER — Telehealth (INDEPENDENT_AMBULATORY_CARE_PROVIDER_SITE_OTHER): Payer: Self-pay | Admitting: Emergency Medicine

## 2010-12-12 ENCOUNTER — Encounter: Payer: Self-pay | Admitting: Family Medicine

## 2010-12-12 ENCOUNTER — Inpatient Hospital Stay (INDEPENDENT_AMBULATORY_CARE_PROVIDER_SITE_OTHER)
Admission: RE | Admit: 2010-12-12 | Discharge: 2010-12-12 | Disposition: A | Discharge status: Home / Self Care | Payer: PRIVATE HEALTH INSURANCE | Source: Ambulatory Visit | Attending: Family Medicine | Admitting: Family Medicine

## 2010-12-12 DIAGNOSIS — M7040 Prepatellar bursitis, unspecified knee: Secondary | ICD-10-CM

## 2010-12-12 DIAGNOSIS — M533 Sacrococcygeal disorders, not elsewhere classified: Secondary | ICD-10-CM

## 2010-12-12 DIAGNOSIS — M546 Pain in thoracic spine: Secondary | ICD-10-CM

## 2011-01-15 ENCOUNTER — Encounter: Payer: Self-pay | Admitting: Family Medicine

## 2011-01-22 ENCOUNTER — Encounter: Payer: Self-pay | Admitting: Family Medicine

## 2011-01-22 ENCOUNTER — Ambulatory Visit (INDEPENDENT_AMBULATORY_CARE_PROVIDER_SITE_OTHER): Payer: PRIVATE HEALTH INSURANCE | Admitting: Family Medicine

## 2011-01-22 VITALS — BP 128/74 | HR 65 | Ht 66.0 in | Wt 144.0 lb

## 2011-01-22 DIAGNOSIS — Z0000 Encounter for general adult medical examination without abnormal findings: Secondary | ICD-10-CM

## 2011-01-22 DIAGNOSIS — M79609 Pain in unspecified limb: Secondary | ICD-10-CM

## 2011-01-22 DIAGNOSIS — M79673 Pain in unspecified foot: Secondary | ICD-10-CM

## 2011-01-22 LAB — POCT URINALYSIS DIPSTICK
Bilirubin, UA: NEGATIVE
Blood, UA: NEGATIVE
Glucose, UA: NEGATIVE
Ketones, UA: NEGATIVE
Spec Grav, UA: 1.01

## 2011-01-22 MED ORDER — MELOXICAM 7.5 MG PO TABS: 7.5000 mg | ORAL_TABLET | Freq: Every day | ORAL | Status: DC | PRN

## 2011-01-22 NOTE — Patient Instructions (Signed)
Health Maintenance, Males A healthy lifestyle and preventative care can promote health and wellness.  Maintain regular health, dental, and eye exams.   Eat a healthy diet. Foods like vegetables, fruits, whole grains, low-fat dairy products, and lean protein foods contain the nutrients you need without too many calories. Decrease your intake of foods high in solid fats, added sugars, and salt. Get information about a proper diet from your caregiver, if necessary.   Regular physical exercise is one of the most important things you can do for your health. Most adults should get at least 150 minutes of moderate-intensity exercise (any activity that increases your heart rate and causes you to sweat) each week. In addition, most adults need muscle-strengthening exercises on 2 or more days a week.    Maintain a healthy weight. The body mass index (BMI) is a screening tool to identify possible weight problems. It provides an estimate of body fat based on height and weight. Your caregiver can help determine your BMI, and can help you achieve or maintain a healthy weight. For adults 20 years and older:   A BMI below 18.5 is considered underweight.   A BMI of 18.5 to 24.9 is normal.   A BMI of 25 to 29.9 is considered overweight.   A BMI of 30 and above is considered obese.   Maintain normal blood lipids and cholesterol by exercising and minimizing your intake of saturated fat. Eat a balanced diet with plenty of fruits and vegetables. Blood tests for lipids and cholesterol should begin at age 20 and be repeated every 5 years. If your lipid or cholesterol levels are high, you are over 50, or you are a high risk for heart disease, you may need your cholesterol levels checked more frequently.Ongoing high lipid and cholesterol levels should be treated with medicines, if diet and exercise are not effective.   If you smoke, find out from your caregiver how to quit. If you do not use tobacco, do not start.    If you choose to drink alcohol, do not exceed 2 drinks per day. One drink is considered to be 12 ounces (355 mL) of beer, 5 ounces (148 mL) of wine, or 1.5 ounces (44 mL) of liquor.   Avoid use of street drugs. Do not share needles with anyone. Ask for help if you need support or instructions about stopping the use of drugs.   High blood pressure causes heart disease and increases the risk of stroke. Blood pressure should be checked at least every 1 to 2 years. Ongoing high blood pressure should be treated with medicines if weight loss and exercise are not effective.   If you are 45 to 29 years old, ask your caregiver if you should take aspirin to prevent heart disease.   Diabetes screening involves taking a blood sample to check your fasting blood sugar level. This should be done once every 3 years, after age 45, if you are within normal weight and without risk factors for diabetes. Testing should be considered at a younger age or be carried out more frequently if you are overweight and have at least 1 risk factor for diabetes.   Colorectal cancer can be detected and often prevented. Most routine colorectal cancer screening begins at the age of 50 and continues through age 75. However, your caregiver may recommend screening at an earlier age if you have risk factors for colon cancer. On a yearly basis, your caregiver may provide home test kits to check for hidden   blood in the stool. Use of a small camera at the end of a tube, to directly examine the colon (sigmoidoscopy or colonoscopy), can detect the earliest forms of colorectal cancer. Talk to your caregiver about this at age 74, when routine screening begins. Direct examination of the colon should be repeated every 5 to 10 years through age 76, unless early forms of pre-cancerous polyps or small growths are found.   Healthy men should no longer receive prostate-specific antigen (PSA) blood tests as part of routine cancer screening. Consult with  your caregiver about prostate cancer screening.   Practice safe sex. Use condoms and avoid high-risk sexual practices to reduce the spread of sexually transmitted infections (STIs).   Use sunscreen with a sun protection factor (SPF) of 30 or greater. Apply sunscreen liberally and repeatedly throughout the day. You should seek shade when your shadow is shorter than you. Protect yourself by wearing long sleeves, pants, a wide-brimmed hat, and sunglasses year round, whenever you are outdoors.   Notify your caregiver of new moles or changes in moles, especially if there is a change in shape or color. Also notify your caregiver if a mole is larger than the size of a pencil eraser.   A one-time screening for abdominal aortic aneurysm (AAA) and surgical repair of large AAAs by sound wave imaging (ultrasonography) is recommended for ages 40 to 19 years who are current or former smokers.   Stay current with your immunizations.  Document Released: 08/29/2007 Document Revised: 11/12/2010 Document Reviewed: 07/28/2010 [facility] Patient Information 2012 [place], [place].Flat Feet Having flat feet is a common condition. One foot or both might be affected. People of any age can have flat feet. In fact, everyone is born with them. But most of the time, the foot gradually develops an arch. That is the curve on the bottom of the foot that creates a gap between the foot and the ground. An arch usually develops in childhood. Sometimes, though, an arch never develops and the foot stays flat on the bottom. Other times, an arch develops but later collapses (caves in). That is what gives the condition its nickname, "fallen arches." The medical term for flat feet is pes planus. Some people have flat feet their whole life and have no problems. For others, the condition causes pain and needs to be corrected.  CAUSES   A problem with the foot's soft tissue; tendons and ligaments could be loose.   This can cause what is called  flexible flat feet. That means the shape of the foot changes with pressure. When standing on the toes, a curved arch can be seen. When standing on the ground, the foot is flat.   Wear and tear. Sometimes arches simply flatten over time.   Damage to the posterior tibial tendon. This is the tendon that goes from the inside of the ankle to the bones in the middle of the foot. It is the main support for the arch. If the tendon is injured, stretched or torn, the arch might flatten.   Tarsal coalition. With this condition, two or more bones in the foot are joined together (fused ) during development in the womb. This limits movement and can lead to a flat foot.  SYMPTOMS   The foot is even with the ground from toe to heel. Your caregiver will look closely at the inside of the foot while you are standing.   Pain along the bottom of the foot. Some people describe the pain as tightness.  Swelling on the inside of the foot or ankle.   Changes in the way you walk (gait).   The feet lean inward, starting at the ankle (pronation).  DIAGNOSIS  To decide if a child or adult has flat feet, a healthcare provider will probably:  Do a physical examination. This might include having the person stand on his or her toes and then stand normally. The caregiver will also hold the foot and put pressure on the foot in different directions.   Check the person's shoes. The pattern of wear on the soles can offer clues.   Order images (pictures) of the foot. They can help identify the cause of any pain. They also will show injuries to bones or tendons that could be causing the condition. The images can come from:   X-rays.   Computed tomography (CT) scan. This combines X-ray and a computer.   Magnetic resonance imaging (MRI). This uses magnets, radio waves and a computer to take a picture of the foot. It is the best technique to evaluate tendons, ligaments and muscles.  TREATMENT   Flexible flat feet usually are  painless. Most of the time, gait is not affected. Most children grow out of the condition. Often no treatment is needed. If there is pain, treatment options include:   Orthotics. These are inserts that go in the shoes. They add support and shape to the feet. An orthotic is custom-made from a mold of the foot.   Shoes. Not all shoes are the same. People with flat feet need arch support. However, too much can be painful. It is important to find shoes that offer the right amount of support. Athletes, especially runners, may need to try shoes made just for people with flatter feet.   Medication. For pain, only take over-the-counter medicine for pain, discomfort, as directed by your caregiver.   Rest. If the feet start to hurt, cut back on the exercise which increases the pain. Use common sense.   For damage to the posterior tibial tendon, options include:   Orthotics. Also adding a wedge on the inside edge may help. This can relieve pressure on the tendon.   Ankle brace, boot or cast. These supports can ease the load on the tendon while it heals.   Surgery. If the tendon is torn, it might need to be repaired.   For tarsal coalition, similar options apply:   Pain medication.   Orthotics.   A cast and crutches. This keeps weight off the foot.   Physical therapy.   Surgery to remove the bone bridge joining the two bones together.  PROGNOSIS  In most people, flat feet do not cause pain or problems. People can go about their normal activities. However, if flat feet are painful, they can and should be treated. Treatment usually relieves the pain. HOME CARE INSTRUCTIONS   Take any medications prescribed by the healthcare provider. Follow the directions carefully.   Wear, or make sure a child wears, orthotics or special shoes if this was suggested. Be sure to ask how often and for how long they should be worn.   Do any exercises or therapy treatments that were suggested.   Take notes on  when the pain occurs. This will help healthcare providers decide how to treat the condition.   If surgery is needed, be sure to find out if there is anything that should or should not be done before the operation.  SEEK MEDICAL CARE IF:   Pain worsens in  the foot or lower leg.   Pain disappears after treatment, but then returns.   Walking or simple exercise becomes difficult or causes foot pain.   Orthotics or special shoes are uncomfortable or painful.  Document Released: 12/28/2008 Document Revised: 11/12/2010 Document Reviewed: 12/28/2008 [facility] Patient Information 2012 [place], [place].

## 2011-01-22 NOTE — Progress Notes (Signed)
Subjective:    Patient ID: Gary Doyle, male    DOB: 03-03-1981, 30 y.o.   MRN: [id number]  HPI  Patient's here for physical examination.  Review of Systems  Musculoskeletal:       Bilateral foot and leg pain w/prolong standing  All other systems reviewed and are negative.   Allergies  Allergen Reactions  . Hydrocodone    History   Social History  . Marital Status: Divorced    Spouse Name: N/A    Number of Children: N/A  . Years of Education: N/A   Occupational History  . Not on file.   Social History Main Topics  . Smoking status: Former Games developer  . Smokeless tobacco: Not on file  . Alcohol Use: 0.5 oz/week    1 drink(s) per week     per week  . Drug Use: No  . Sexually Active: Not on file     lives with parents, divorced X 1, [employer], rides bike 2 X week, [employer] employer   Other Topics Concern  . Not on file   Social History Narrative  . No narrative on file   Family History  Problem Relation Age of Onset  . Depression Sister   . Bipolar disorder     Past Medical History  Diagnosis Date  . Depression   . Asthma   . Allergy    Past Surgical History  Procedure Date  . Rhinoplasty   . Hernia repair         BP 128/74  Pulse 65  Ht 5\' 6"  (1.676 m)  Wt 144 lb (65.318 kg)  BMI 23.24 kg/m2  SpO2 99%  Objective:   Physical Exam  Constitutional: He is oriented to person, place, and time. He appears well-developed and well-nourished.  HENT:  Head: Normocephalic and atraumatic.  Right Ear: External ear normal.  Left Ear: External ear normal.  Eyes: Conjunctivae are normal. Pupils are equal, round, and reactive to light.  Fundoscopic exam:      The right eye shows no arteriolar narrowing, no AV nicking and no exudate.       The left eye shows no arteriolar narrowing, no AV nicking and no exudate.  Neck: Normal range of motion. Neck supple. No tracheal deviation present. No thyromegaly present.  Cardiovascular: Normal rate, regular rhythm,  normal heart sounds and intact distal pulses.  Exam reveals no gallop and no friction rub.   No murmur heard. Pulmonary/Chest: Effort normal and breath sounds normal.  Abdominal: Soft. Bowel sounds are normal. He exhibits mass. He exhibits no distension. There is no tenderness. There is no rebound and no guarding. Hernia confirmed negative in the right inguinal area and confirmed negative in the left inguinal area.  Genitourinary: Testes normal and penis normal. Circumcised.  Musculoskeletal: Normal range of motion. He exhibits no edema.       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  Neurological: He is alert and oriented to person, place, and time. He has normal reflexes. He displays normal reflexes. No cranial nerve deficit. He exhibits normal muscle tone. Coordination normal.  Skin: Skin is warm and dry.  Psychiatric: He has a normal mood and affect. His behavior is normal. Thought content normal.      Results for orders placed in visit on 01/22/11  POCT URINALYSIS DIPSTICK      Component Value Range   Color, UA yellow     Clarity, UA clear  Glucose, UA neg     Bilirubin, UA neg     Ketones, UA neg     Spec Grav, UA 1.010     Blood, UA neg     pH, UA 6.5     Protein, UA neg     Urobilinogen, UA 0.2     Nitrite, UA neg     Leukocytes, UA neg        Assessment & Plan:  Health maintenance will obtain routine lab work CMP TSH and CBC with lipid profile. Also fallen arches Mobic 7.5 mg on a when necessary basis and recommend referral for [facility] to see if arche support might be helpful. Return to office in a year for followup and repeat PE.

## 2011-02-16 NOTE — Telephone Encounter (Signed)
  Phone Note Outgoing Call   Call placed by: Lavell Islam RN,  November 27, 2010 5:59 PM Call placed to: Patient Summary of Call: Spoke with patient who is not feeling much better; started Prednisone; still fatigued; will consider getting Z-pack. Initial call taken by: Lavell Islam RN,  November 27, 2010 6:00 PM

## 2011-02-16 NOTE — Progress Notes (Signed)
Summary: CONGESTION, TIRED, AND ASTHMA...WSE rm  3   Vital Signs:  Patient Profile:   30 Years Old Male CC:      congestion, asthma x 1 day Height:     66.25 inches Weight:      143.25 pounds O2 Sat:      98 % O2 treatment:    Room Air Temp:     99.5 degrees F oral Pulse rate:   70 / minute Resp:     16 per minute BP sitting:   130 / 83  (left arm) Cuff size:   regular  Vitals Entered By: Clemens Catholic LPN (November 25, 2010 5:34 PM)                  Updated Prior Medication List: No Medications Current Allergies (reviewed today): No known allergies History of Present Illness Chief Complaint: congestion, asthma x 1 day History of Present Illness:  Subjective: Patient complains of awakening yesterday with sinus congestion and ears clogged.  Has past history of mild asthma; albuterol inhaler helped No sore throat but throat feels "scratchy" + mild non-productive cough today. No pleuritic pain + wheezing + nasal congestion No post-nasal drainage No sinus pain/pressure No itchy/red eyes No earache No hemoptysis + SOB with activity ? fever/chills yesterday No nausea No vomiting No abdominal pain No diarrhea No skin rashes + fatigue No myalgias No headache    REVIEW OF SYSTEMS Constitutional Symptoms      Denies fever, chills, night sweats, weight loss, weight gain, and fatigue.  Eyes       Denies change in vision, eye pain, eye discharge, glasses, contact lenses, and eye surgery. Ear/Nose/Throat/Mouth       Complains of sinus problems.      Denies hearing loss/aids, change in hearing, ear pain, ear discharge, dizziness, frequent runny nose, frequent nose bleeds, sore throat, hoarseness, and tooth pain or bleeding.  Respiratory       Complains of dry cough, wheezing, and asthma.      Denies productive cough, shortness of breath, bronchitis, and emphysema/COPD.  Cardiovascular       Denies murmurs, chest pain, and tires easily with exhertion.     Gastrointestinal       Denies stomach pain, nausea/vomiting, diarrhea, constipation, blood in bowel movements, and indigestion. Genitourniary       Denies painful urination, kidney stones, and loss of urinary control. Neurological       Denies paralysis, seizures, and fainting/blackouts. Musculoskeletal       Denies muscle pain, joint pain, joint stiffness, decreased range of motion, redness, swelling, muscle weakness, and gout.  Skin       Denies bruising, unusual mles/lumps or sores, and hair/skin or nail changes.  Psych       Denies mood changes, temper/anger issues, anxiety/stress, speech problems, depression, and sleep problems. Other Comments: pt c/o dry cough, nasal congestion, bilateral ear fullness, and episodes of asthma (SOB)x 1 day. he took tylenol cold med yesterday. he needs a refill on rescue inhaler, his is expired.   Past History:  Past Medical History: Reviewed history from 12/17/2009 and no changes required. depression since 2005 mild intermitent asthma allergies  Past Surgical History: Reviewed history from 05/09/2009 and no changes required. rhinoplasty x 2 bilat hernia surgery Dr Gerrit Friends 2010-2011  Family History: Reviewed history from 11/29/2006 and no changes required. mother and father healthy sister depression grandmother bipolar d/o  Social History: Reviewed history from 11/29/2006 and no changes required. 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 Lives with parents in [place], [place] Divorced x 1 . Never smoked.  Denies drug use. 1 ETOH per month. Fair diet and rides bike 2 x a wk.   Objective:  Appearance:  Patient appears healthy, stated age, and in no acute distress  Eyes:  Pupils are equal, round, and reactive to light and accomodation.  Extraocular movement is intact.  Conjunctivae are not inflamed.  Ears:  Canals normal.  Tympanic membranes normal.   Nose:  Mildly congested turbinates.  No sinus tenderness  Pharynx:  Normal  Neck:   Supple.  Slightly tender shotty posterior nodes are palpated bilaterally.  Lungs:  Clear to auscultation.  Breath sounds are equal.  Heart:  Regular rate and rhythm without murmurs, rubs, or gallops.  Abdomen:  Nontender without masses or hepatosplenomegaly.  Bowel sounds are present.  No CVA or flank tenderness.  Extremities:  No edema.   Skin:  No rash Assessment New Problems: ACUTE BRONCHOSPASM (ICD-519.11) UPPER RESPIRATORY INFECTION, ACUTE (ICD-465.9)   Plan New Medications/Changes: PREDNISONE 10 MG TABS (PREDNISONE) 2 PO today, then 2 BID for 2 days, then 1 two times a day for 2 days, then 1 daily for 2 days.  Take PC  #16 x 0, 11/25/2010, Donna Christen MD AZITHROMYCIN 250 MG TABS (AZITHROMYCIN) Two tabs by mouth on day 1, then 1 tab daily on days 2 through 5 (Rx void after 12/03/10)  #6 tabs x 0, 11/25/2010, Donna Christen MD PROAIR HFA 108 (90 BASE) MCG/ACT AERS (ALBUTEROL SULFATE) Two inhalations q4-6hr as needed.  Max 12 puffs/day  #1 MDI x 1, 11/25/2010, Donna Christen MD BENZONATATE 200 MG CAPS (BENZONATATE) One by mouth hs as needed cough  #12 x 0, 11/25/2010, Donna Christen MD  New Orders: Pulse Oximetry (single measurment) [id number] Services provided After hours-Weekends-Holidays [99051] New Patient Level III [99203] Planning Comments:   Treat symptomatically for now:   Increase fluid intake, begin expectorant/decongestant, topical decongestant,  cough suppressant at bedtime.  Begin tapering course of prednisone.  Continue albutero inhaler.   If fever/chills/sweats persist, or if not improving 5  days begin Z-pack (given Rx to hold).  Followup with PCP if not improving 7 to 10 days.   The patient and/or caregiver has been counseled thoroughly with regard to medications prescribed including dosage, schedule, interactions, rationale for use, and possible side effects and they verbalize understanding.  Diagnoses and expected course of recovery discussed and will return if not  improved as expected or if the condition worsens. Patient and/or caregiver verbalized understanding.  Prescriptions: PREDNISONE 10 MG TABS (PREDNISONE) 2 PO today, then 2 BID for 2 days, then 1 two times a day for 2 days, then 1 daily for 2 days.  Take PC  #16 x 0   Entered and Authorized by:   Donna Christen MD   Signed by:   Donna Christen MD on 11/25/2010   Method used:   Electronically to        [facility] [phone]* (retail)       [address] [place], [place], [place]  [address]       Ph: [phone]       Fax: [phone]   RxID:   [id number] AZITHROMYCIN 250 MG TABS (AZITHROMYCIN) Two tabs by mouth on day 1, then 1 tab daily on days 2 through 5 (Rx void after 12/03/10)  #6 tabs x 0   Entered and Authorized by:   Donna Christen MD  Signed by:   Donna Christen MD on 11/25/2010   Method used:   Print then Give to Patient   RxID:   [id number] PROAIR HFA 108 (90 BASE) MCG/ACT AERS (ALBUTEROL SULFATE) Two inhalations q4-6hr as needed.  Max 12 puffs/day  #1 MDI x 1   Entered and Authorized by:   Donna Christen MD   Signed by:   Donna Christen MD on 11/25/2010   Method used:   Electronically to        [facility] [phone]* (retail)       [address] [place], [place]  [address]       Ph: [phone]       Fax: [phone]   RxID:   [id number] BENZONATATE 200 MG CAPS (BENZONATATE) One by mouth hs as needed cough  #12 x 0   Entered and Authorized by:   Donna Christen MD   Signed by:   Donna Christen MD on 11/25/2010   Method used:   Electronically to        [facility] [phone]* (retail)       [address] [place], [place]  [address]       Ph: [phone]       Fax: [phone]   RxID:   [id number]   Patient Instructions: 1)  Take Mucinex D (guaifenesin with decongestant) twice daily for congestion. 2)  Increase fluid intake, rest. 3)  May use Afrin nasal spray (or generic oxymetazoline) twice daily for about 5 days.  Also recommend using saline nasal  spray several times daily and/or saline nasal irrigation. 4)  Begin Azithromycin if not improving about 5 days or if persistent fever develops. 5)  Followup with family doctor if not improving 7 to 10 days.   Orders Added: 1)  Pulse Oximetry (single measurment) [94760] 2)  Services provided After hours-Weekends-Holidays [99051] 3)  New Patient Level III [15176]

## 2011-02-16 NOTE — Progress Notes (Signed)
Summary: Back Pain (rm 4)   Vital Signs:  Patient Profile:   30 Years Old Male CC:      mid and lower back pain x 6 days Height:     66.25 inches Weight:      140 pounds O2 Sat:      98 % O2 treatment:    Room Air Temp:     99.3 degrees F oral Pulse rate:   77 / minute Resp:     16 per minute BP sitting:   118 / 71  (left arm) Cuff size:   regular  Pt. in pain?   yes    Location:   back    Intensity:   6    Type:       dull  Vitals Entered By: Lajean Saver RN (December 12, 2010 9:13 AM)                   Updated Prior Medication List: No Medications Current Allergies: ! * OXYCODONEHistory of Present Illness Chief Complaint: mid and lower back pain x 6 days History of Present Illness:  Subjective:  Patient complains of mild increase in mid-back pain for about 6 days.  He states that he often has recurring pain in his lower back also.  The pain is worse when sitting and bending over, better when standing.  The pain does not radiated.  No bowel or bladder dysfunction.  No saddle numbness.  He also has mild intermittent bilateral knee pain, most noticeable when standing up from a squatting position.   No recent injury.  His job involves constant lifting, and he often must kneel on his knees.  No recent change in activities.  REVIEW OF SYSTEMS Constitutional Symptoms      Denies fever, chills, night sweats, weight loss, weight gain, and fatigue.  Eyes       Denies change in vision, eye pain, eye discharge, glasses, contact lenses, and eye surgery. Ear/Nose/Throat/Mouth       Denies hearing loss/aids, change in hearing, ear pain, ear discharge, dizziness, frequent runny nose, frequent nose bleeds, sinus problems, sore throat, hoarseness, and tooth pain or bleeding.  Respiratory       Denies dry cough, productive cough, wheezing, shortness of breath, asthma, bronchitis, and emphysema/COPD.  Cardiovascular       Denies murmurs, chest pain, and tires easily with exhertion.     Gastrointestinal       Denies stomach pain, nausea/vomiting, diarrhea, constipation, blood in bowel movements, and indigestion. Genitourniary       Denies painful urination, kidney stones, and loss of urinary control. Neurological       Denies paralysis, seizures, and fainting/blackouts. Musculoskeletal       Complains of muscle pain.      Denies joint pain, joint stiffness, decreased range of motion, redness, swelling, muscle weakness, and gout.  Skin       Denies bruising, unusual mles/lumps or sores, and hair/skin or nail changes.  Psych       Denies mood changes, temper/anger issues, anxiety/stress, speech problems, depression, and sleep problems. Other Comments: Patient c/o mid and lowe back pain x 6 days. Cannot pinpoint a cause. No OTC meds or heat/cold used for pain    Past History:  Past Medical History: Reviewed history from 12/17/2009 and no changes required. depression since 2005 mild intermitent asthma allergies  Past Surgical History: Reviewed history from 05/09/2009 and no changes required. rhinoplasty x 2 bilat hernia surgery Dr Gerrit Friends 2010-2011  Family History: Reviewed history from 11/29/2006 and no changes required. mother and father healthy sister depression grandmother bipolar d/o  Social History: Lives with parents in [place], [place] Divorced x 1 . Never smoked.  Denies drug use. 1 ETOH per month. Fair diet and rides bike 2 x a wk. 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  Breath sounds are equal.  Heart:  Regular rate and rhythm without murmurs, rubs, or gallops.  Abdomen:  Nontender without masses or hepatosplenomegaly.  Bowel sounds are present.  No CVA or flank tenderness.   Back:  Can heel/toe walk and squat without difficulty.  There is tenderness along the medial and inferior edges of right scapula.  Pain is elicited by resisted  abduction of the right shoulder.  There is also tenderness over both SI joints.  Straight leg raising test is negative.  Sitting knee extension test is negative.  Strength and sensation in the lower extremities is normal.  Patellar and achilles reflexes are normal.     Bilateral knees:  No effusion,  erythema, or warmth.  Knee stable, negative drawer test.  McMurray test negative.  There is mild tenderness over pre-patellar bursae bilaterally, right worse than left  Assessment New Problems: BACK PAIN, THORACIC REGION (ICD-724.1) SACROILIAC JOINT DYSFUNCTION (ICD-724.6) PREPATELLAR BURSITIS (ICD-726.65)  RIGHT RHOMBOID INFLAMMATION/STREAIN SACROILIAC PAIN BILATERAL PRE-PATELLAR BURSITIS (MILD)  Plan New Medications/Changes: NAPROXEN 500 MG TABS (NAPROXEN) One by mouth two times a day pc  #20 x 1, 12/12/2010, Donna Christen MD  New Orders: Est. Patient Level IV [34742] Planning Comments:   Begin applying ice pack several times daily.  Begin Naproxen two times a day.  Recommend wearing knee pads at work. Begin range of motion and stretching exercises ([facility] information and instruction patient handouts given) Followup with [facility] if not improved in two weeks.                                                                                                                                                                                                                                                                       The patient and/or caregiver has been counseled thoroughly with regard to medications prescribed including dosage, schedule, interactions, rationale for use, and possible side effects and they verbalize understanding.  Diagnoses and expected course of recovery discussed and will return if not improved as expected or if the condition worsens. Patient and/or caregiver verbalized understanding.  Prescriptions: NAPROXEN 500 MG TABS (NAPROXEN) One by mouth two times a day pc  #20 x 1   Entered and Authorized by:   Donna Christen MD   Signed by:   Donna Christen MD on 12/12/2010   Method used:   Print then Give to Patient   RxID:   [id number]   Orders Added: 1)  Est. Patient Level IV [88416]

## 2011-04-27 ENCOUNTER — Encounter: Payer: Self-pay | Admitting: Physician Assistant

## 2011-04-27 ENCOUNTER — Ambulatory Visit (INDEPENDENT_AMBULATORY_CARE_PROVIDER_SITE_OTHER): Payer: PRIVATE HEALTH INSURANCE | Admitting: Physician Assistant

## 2011-04-27 VITALS — BP 119/68 | HR 69 | Temp 98.6°F | Ht 66.0 in | Wt 144.0 lb

## 2011-04-27 DIAGNOSIS — J45901 Unspecified asthma with (acute) exacerbation: Secondary | ICD-10-CM

## 2011-04-27 DIAGNOSIS — J069 Acute upper respiratory infection, unspecified: Secondary | ICD-10-CM

## 2011-04-27 MED ORDER — PREDNISONE 10 MG PO TABS: 40.0000 mg | ORAL_TABLET | Freq: Every day | ORAL | Status: DC

## 2011-04-27 NOTE — Patient Instructions (Signed)
Continue using Mucinex twice a day. Sinus rinse. Nasal saline and can Vaseline for nose dryness and bleeding. Prednisone for 5 days. Call if not improving by Friday.

## 2011-04-27 NOTE — Progress Notes (Signed)
  Subjective:    Patient ID: Gary Doyle, male    DOB: Feb 09, 1981, 31 y.o.   MRN: [id number]  HPI Patient presents to the clinic with having to use his inhaler more at night. 1 week ago last Monday patient had cold like symptoms. He denies fever, chills, nausea, vomiting, ear pain, or sore throat. He has had some sinus headaches that have been off and on for one week. He has had a few nosebleeds from his right nostril. He does not feel that bad during the day but at night he has problems with shortness of breath and wheezing. He has a history of asthma but has been controlled. She's had to use his inhaler every night for the last 4 nights. After using the inhaler once his symptoms to resolve and he is able rest. He does have cough is better in the day and worse at night. He never remembers having any type of spirometry.     Review of Systems     Objective:   Physical Exam  Constitutional: He is oriented to person, place, and time. He appears well-developed and well-nourished.  HENT:  Head: Normocephalic and atraumatic.  Right Ear: External ear normal.  Left Ear: External ear normal.  Mouth/Throat: Oropharynx is clear and moist. No oropharyngeal exudate.       Dried blood in right nare. Bilateral edematous turbinates. No maxillary tenderness.  Eyes: Conjunctivae are normal.  Neck: Normal range of motion. Neck supple.  Cardiovascular: Normal rate, regular rhythm and normal heart sounds.   Pulmonary/Chest: Effort normal and breath sounds normal. He has no wheezes.  Neurological: He is alert and oriented to person, place, and time.  Skin: Skin is warm and dry.  Psychiatric: He has a normal mood and affect. His behavior is normal.          Assessment & Plan:  Asthma exacerbation- Prednisone 40mg  for 5 days. Continue using Albuterol inhaler as needed. If still having to use inhaler after finishing prednisone then we need to consider inhaled glucocorticoid steroid. Discuss need for  spirometry after exacerbation resolved.   URI- Continue Mucinex BID. Sinus rinses would also be beneficial. Can use vaseline in nostrils to help with dryness along with nasal saline. Call if not improving by Friday and we can call in abx.

## 2011-06-11 ENCOUNTER — Encounter: Payer: Self-pay | Admitting: *Deleted

## 2011-06-15 ENCOUNTER — Ambulatory Visit (INDEPENDENT_AMBULATORY_CARE_PROVIDER_SITE_OTHER): Payer: PRIVATE HEALTH INSURANCE | Admitting: Physician Assistant

## 2011-06-15 ENCOUNTER — Encounter: Payer: Self-pay | Admitting: Physician Assistant

## 2011-06-15 VITALS — BP 118/54 | HR 77 | Ht 66.0 in | Wt 147.0 lb

## 2011-06-15 DIAGNOSIS — J3489 Other specified disorders of nose and nasal sinuses: Secondary | ICD-10-CM

## 2011-06-15 DIAGNOSIS — Z23 Encounter for immunization: Secondary | ICD-10-CM

## 2011-06-15 DIAGNOSIS — T485X5A Adverse effect of other anti-common-cold drugs, initial encounter: Secondary | ICD-10-CM

## 2011-06-15 MED ORDER — MOMETASONE FUROATE 50 MCG/ACT NA SUSP: 2.0000 | Freq: Every day | NASAL | Status: DC

## 2011-06-15 NOTE — Patient Instructions (Signed)
STop Afrin and don't use again. Start Nasonex 2 sprays each nostril once a day. Use for next 6 weeks and then can use as needed. Consider nasal saline spray to use before nasonex to keep nose hydrated and prevent nosebleeds. If not improving in next 6-8 weeks. Call office. Can also use OTC zyrtec and Claritin daily for allergies and nasal congestion.

## 2011-06-16 NOTE — Progress Notes (Signed)
  Subjective:    Patient ID: Gary Doyle, male    DOB: 1980-08-25, 31 y.o.   MRN: [id number]  HPI Patient presents to the clinic because he has been on Afrin for 4 years off and on. Patient has always had problems with his nose. 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 He has been off of the Afrin for one and half weeks. The worse congestion was the first or days but since then it has gotten significantly better. She wants to know what he can do from now on when he gets nasal congestion. He denies any other upper respiratory symptoms today such as: Cough, fever, wheezing, sinus pressure, sore throat or ear pain.  He is not on any medications currently.   Review of Systems     Objective:   Physical Exam  Constitutional: He is oriented to person, place, and time. He appears well-developed and well-nourished.  HENT:  Head: Normocephalic and atraumatic.  Right Ear: External ear normal.  Left Ear: External ear normal.  Mouth/Throat: Oropharynx is clear and moist. No oropharyngeal exudate.       Bilateral turbinates are very erythematous and swollen. The nares look very irritated and dry with some crusted blood on the septum.  Eyes: Conjunctivae are normal.  Neck: Normal range of motion. Neck supple.  Cardiovascular: Normal rate, regular rhythm and normal heart sounds.   Pulmonary/Chest: Effort normal and breath sounds normal. He has no wheezes.  Neurological: He is alert and oriented to person, place, and time.  Psychiatric: He has a normal mood and affect. His behavior is normal.          Assessment & Plan:  Nasal congestion due to prolonged use a decongestant-reassured the patient that a like his nasal septum was not deteriorating and this might be partly due to the  plastic piece in his nose. STop Afrin and don't use again. Start Nasonex 2 sprays each nostril once a day. Use for next 6 weeks and then can use as needed. Consider nasal saline spray to use before nasonex to keep nose hydrated and prevent nosebleeds. If not improving in next 6-8 weeks. Call office. Can also use OTC zyrtec and Claritin daily for allergies and nasal congestion.  Tdap Was given today.

## 2011-06-22 ENCOUNTER — Other Ambulatory Visit: Payer: PRIVATE HEALTH INSURANCE | Admitting: Physician Assistant

## 2011-07-23 ENCOUNTER — Ambulatory Visit (INDEPENDENT_AMBULATORY_CARE_PROVIDER_SITE_OTHER): Payer: PRIVATE HEALTH INSURANCE | Admitting: Family Medicine

## 2011-07-23 ENCOUNTER — Encounter: Payer: Self-pay | Admitting: Family Medicine

## 2011-07-23 VITALS — BP 112/58 | HR 70 | Temp 98.7°F | Wt 147.0 lb

## 2011-07-23 DIAGNOSIS — J329 Chronic sinusitis, unspecified: Secondary | ICD-10-CM

## 2011-07-23 MED ORDER — AMOXICILLIN-POT CLAVULANATE 875-125 MG PO TABS: 1.0000 | ORAL_TABLET | Freq: Two times a day (BID) | ORAL | Status: AC

## 2011-07-23 NOTE — Patient Instructions (Signed)

## 2011-07-23 NOTE — Progress Notes (Signed)
  Subjective:    Patient ID: Gary Doyle, male    DOB: October 29, 1980, 31 y.o.   MRN: [id number]  HPI Severe sinus congeson and pressure for 8-9 day.  Pin in the middle of the nose and in the back of hte head. . Worse when bends forward.  Felt feverish last night. Had cold chills.  No ST or ear pain or pressure.  No cough or chest sxs.  Tooke some Allegra -D but didn't really help.  No cold meds.  Used a sinus rinse for about 2 days.     Review of Systems     Objective:   Physical Exam  Constitutional: He is oriented to person, place, and time. He appears well-developed and well-nourished.  HENT:  Head: Normocephalic and atraumatic.  Right Ear: External ear normal.  Left Ear: External ear normal.  Nose: Nose normal.  Mouth/Throat: Oropharynx is clear and moist.       TMs and canals are clear. Tender over the nasal bridge.   Eyes: Conjunctivae and EOM are normal. Pupils are equal, round, and reactive to light.  Neck: Neck supple. No thyromegaly present.  Cardiovascular: Normal rate and normal heart sounds.   Pulmonary/Chest: Effort normal and breath sounds normal.  Lymphadenopathy:    He has no cervical adenopathy.  Neurological: He is alert and oriented to person, place, and time.  Skin: Skin is warm and dry.  Psychiatric: He has a normal mood and affect.          Assessment & Plan:  Sinsusitis w/ AR.  Will tx with augmenting for 10 days. Call if not better in one week. Continue allergy meds too.

## 2011-08-20 ENCOUNTER — Telehealth: Payer: Self-pay | Admitting: *Deleted

## 2011-08-20 DIAGNOSIS — Z711 Person with feared health complaint in whom no diagnosis is made: Secondary | ICD-10-CM

## 2011-08-20 NOTE — Telephone Encounter (Signed)
Pt is requesting to find out what his blood type. States that he needs to come and have blood work done and I informed him that we could possibly do it the same day. Please advise if this is ok to order.

## 2011-08-20 NOTE — Telephone Encounter (Signed)
Printed lab slip, He will have to pay out of pocket. Insurance dosen't pay for blood typing.

## 2011-08-21 NOTE — Telephone Encounter (Signed)
Pt informed

## 2012-08-26 ENCOUNTER — Ambulatory Visit (INDEPENDENT_AMBULATORY_CARE_PROVIDER_SITE_OTHER): Payer: PRIVATE HEALTH INSURANCE | Admitting: Family Medicine

## 2012-08-26 ENCOUNTER — Encounter: Payer: Self-pay | Admitting: Family Medicine

## 2012-08-26 VITALS — BP 127/77 | HR 70 | Temp 98.4°F | Wt 154.0 lb

## 2012-08-26 DIAGNOSIS — J329 Chronic sinusitis, unspecified: Secondary | ICD-10-CM

## 2012-08-26 DIAGNOSIS — A499 Bacterial infection, unspecified: Secondary | ICD-10-CM

## 2012-08-26 DIAGNOSIS — B9689 Other specified bacterial agents as the cause of diseases classified elsewhere: Secondary | ICD-10-CM

## 2012-08-26 MED ORDER — AZITHROMYCIN 250 MG PO TABS: ORAL_TABLET | ORAL | Status: AC

## 2012-08-26 NOTE — Progress Notes (Signed)
CC: Gary Doyle is a 32 y.o. male is here for Nasal Congestion   Subjective: HPI:  Patient complains of nasal congestion and facial pressure of mild to moderate severity is been present for 2 weeks. It is worse first thing in the morning and leaning forward. Slight improvement with Mucinex. No other interventions. Nothing else is made better or worse. Associated with one day of fever now resolved. Symptoms are slightly better last weekend rebounded much worse severity as the week has progressed. Denies chills, cough, shortness of breath, motor sensory disturbances, headache, rash   Review Of Systems Outlined In HPI  Past Medical History  Diagnosis Date  . Depression   . Asthma   . Allergy      Family History  Problem Relation Age of Onset  . Depression Sister   . Bipolar disorder       History  Substance Use Topics  . Smoking status: Former Games developer  . Smokeless tobacco: Not on file  . Alcohol Use: 0.5 oz/week    1 drink(s) per week     Comment: per week     Objective: Filed Vitals:   08/26/12 0839  BP: 127/77  Pulse: 70  Temp: 98.4 F (36.9 C)    General: Alert and Oriented, No Acute Distress HEENT: Pupils equal, round, reactive to light. Conjunctivae clear.  External ears unremarkable, canals clear with intact TMs with appropriate landmarks.  Middle ear appears open without effusion. Boggy erythematous inferior turbinates with mild mucoid discharge.  Moist mucous membranes, pharynx without inflammation nor lesions.  Neck supple without palpable lymphadenopathy nor abnormal masses. Frontal sinus tenderness to percussion Lungs: Clear to auscultation bilaterally, no wheezing/ronchi/rales.  Comfortable work of breathing. Good air movement. Cardiac: Regular rate and rhythm. Normal S1/S2.  No murmurs, rubs, nor gallops.   Extremities: No peripheral edema.  Strong peripheral pulses.  Mental Status: No depression, anxiety, nor agitation. Skin: Warm and dry.  Assessment  & Plan: Gary Doyle was seen today for nasal congestion.  Diagnoses and associated orders for this visit:  Bacterial sinusitis - azithromycin (ZITHROMAX) 250 MG tablet; Take two tabs at once on day 1, then one tab daily on days 2-5.    Bacterial sinusitis: Encouraged Augmentin, he asks if he could try azithromycin given success in the past with similar symptoms, prescription for azithromycin asked that he call Monday if no improvement to change to Augmentin. Start Nasonex that he already has at home, consider alkaselzer cold and sinus.  Return if symptoms worsen or fail to improve.

## 2013-05-10 ENCOUNTER — Ambulatory Visit (INDEPENDENT_AMBULATORY_CARE_PROVIDER_SITE_OTHER): Payer: 59 | Admitting: Physician Assistant

## 2013-05-10 ENCOUNTER — Encounter: Payer: Self-pay | Admitting: Physician Assistant

## 2013-05-10 VITALS — BP 125/74 | HR 87 | Wt 164.0 lb

## 2013-05-10 DIAGNOSIS — K130 Diseases of lips: Secondary | ICD-10-CM

## 2013-05-10 DIAGNOSIS — B001 Herpesviral vesicular dermatitis: Secondary | ICD-10-CM

## 2013-05-10 DIAGNOSIS — R220 Localized swelling, mass and lump, head: Secondary | ICD-10-CM

## 2013-05-10 DIAGNOSIS — B009 Herpesviral infection, unspecified: Secondary | ICD-10-CM

## 2013-05-10 MED ORDER — MUPIROCIN 2 % EX OINT: TOPICAL_OINTMENT | CUTANEOUS | Status: DC

## 2013-05-10 MED ORDER — ACYCLOVIR 200 MG PO CAPS: 200.0000 mg | ORAL_CAPSULE | Freq: Every day | ORAL | Status: DC

## 2013-05-10 NOTE — Progress Notes (Signed)
   Subjective:    Patient ID: Gary Doyle, male    DOB: 07/26/1980, 33 y.o.   MRN: [id number]  HPI Patient is a 33 year old male who presents to the clinic with swollen upper and bottom lip with ulceration and drainage. Patient has a history of cold sores. He felt like a cold sore was coming up last night and woke up with swollen lips. He has never had his lip swelling this before. He also noticed a lot more drainage than he usually has. They're very painful. He has not tried anything to make better. He usually does not need any prescription therapy and they go away on her home. Occasionally he will use Abreva but he has not today. He denies any shortness of breath, problem swallowing or swelling of the tongue. He admits to being very stressed trying to sell their home.   Review of Systems     Objective:   Physical Exam  HENT:  Head:            Assessment & Plan:  Cold sores-treated with acyclovir for 5 days. I did give patient one refill if still continuing after 5 days can continue up to 14 days. Discussed cold compresses. Area did seem like it was draining more than usual. Sent over Bactroban to use 3 times a day for the next 5-7 days. If there is any staph in the sore likely this would treat. Discussed with patient if not improving in the next 48 hours to call office to consider oral antibiotic. I did not see any crusting or skin lesion suggestive of impetigo.

## 2013-05-10 NOTE — Patient Instructions (Signed)
Cold Sore  A cold sore (fever blister) is a skin infection caused by the herpes simplex virus (HSV-1). HSV-1 is closely related to the virus that causes gential herpes (HSV-2), but they are not the same even though both viruses can cause oral and genital infections. Cold sores are small, fluid-filled sores inside of the mouth or on the lips, gums, nose, chin, cheeks, or fingers.   The herpes simplex virus can be easily passed (contagious) to other people through close personal contact, such as kissing or sharing personal items. The virus can also spread to other parts of the body, such as the eyes or genitals. Cold sores are contagious until the sores crust over completely. They often heal within 2 weeks.   Once a person is infected, the herpes simplex virus remains permanently in the body. Therefore, there is no cure for cold sores, and they often recur when a person is tired, stressed, sick, or gets too much sun. Additional factors that can cause a recurrence include hormone changes in menstruation or pregnancy, certain drugs, and cold weather.   CAUSES   Cold sores are caused by the herpes simplex virus. The virus is spread from person to person through close contact, such as through kissing, touching the affected area, or sharing personal items such as lip balm, razors, or eating utensils.   SYMPTOMS   The first infection may not cause symptoms. If symptoms develop, the symptoms often go through different stages. Here is how a cold sore develops:   · Tingling, itching, or burning is felt 1 2 days before the outbreak.    · Fluid-filled blisters appear on the lips, inside the mouth, nose, or on the cheeks.    · The blisters start to ooze clear fluid.    · The blisters dry up and a yellow crust appears in its place.    · The crust falls off.    Symptoms depend on whether it is the initial outbreak or a recurrence. Some other symptoms with the first outbreak may include:   · Fever.    · Sore throat.    · Headache.     · Muscle aches.    · Swollen neck glands.    DIAGNOSIS   A diagnosis is often made based on your symptoms and looking at the sores. Sometimes, a sore may be swabbed and then examined in the lab to make a final diagnosis. If the sores are not present, blood tests can find the herpes simplex virus.   TREATMENT   There is no cure for cold sores and no vaccine for the herpes simplex virus. Within 2 weeks, most cold sores go away on their own without treatment. Medicines cannot make the infection go away, but medicine can help relieve some of the pain associated with the sores, can work to stop the virus from multiplying, and can also shorten healing time. Medicine may be in the form of creams, gels, pills, or a shot.   HOME CARE INSTRUCTIONS   · Only take over-the-counter or prescription medicines for pain, discomfort, or fever as directed by your caregiver. Do not use aspirin.    · Use a cotton-tip swab to apply creams or gels to your sores.    · Do not touch the sores or pick the scabs. Wash your hands often. Do not touch your eyes without washing your hands first.    · Avoid kissing, oral sex, and sharing personal items until sores heal.    · Apply an ice pack on your sores for 10 15 minutes to ease any   discomfort.    · Avoid hot, cold, or salty foods because they may hurt your mouth. Eat a soft, bland diet to avoid irritating the sores. Use a straw to drink if you have pain when drinking out of a glass.    · Keep sores clean and dry to prevent an infection of other tissues.    · Avoid the sun and limit stress if these things trigger outbreaks. If sun causes cold sores, apply sunscreen on the lips before being out in the sun.    SEEK MEDICAL CARE IF:   · You have a fever or persistent symptoms for more than 2 3 days.    · You have a fever and your symptoms suddenly get worse.    · You have pus, not clear fluid, coming from the sores.    · You have redness that is spreading.    · You have pain or irritation in your  eye.    · You get sores on your genitals.    · Your sores do not heal within 2 weeks.    · You have a weakened immune system.    · You have frequent recurrences of cold sores.    MAKE SURE YOU:   · Understand these instructions.  · Will watch your condition.  · Will get help right away if you are not doing well or get worse.  Document Released: 02/28/2000 Document Revised: 11/25/2011 Document Reviewed: 07/15/2011  [employer]® Patient Information ©2014 [employer].

## 2013-05-12 ENCOUNTER — Telehealth: Payer: Self-pay | Admitting: *Deleted

## 2013-05-12 ENCOUNTER — Other Ambulatory Visit: Payer: Self-pay | Admitting: Physician Assistant

## 2013-05-12 MED ORDER — CLINDAMYCIN HCL 300 MG PO CAPS: 300.0000 mg | ORAL_CAPSULE | Freq: Four times a day (QID) | ORAL | Status: DC

## 2013-05-12 NOTE — Telephone Encounter (Signed)
Left detailed message notifying pt of rx. 

## 2013-05-12 NOTE — Telephone Encounter (Signed)
Pt left a vm stating that the cold sores on his mouth aren't really getting any better.

## 2013-05-12 NOTE — Telephone Encounter (Signed)
Will add oral abx to use with therapy.

## 2013-07-26 ENCOUNTER — Telehealth: Payer: Self-pay

## 2013-07-26 ENCOUNTER — Other Ambulatory Visit: Payer: Self-pay | Admitting: Physician Assistant

## 2013-07-26 MED ORDER — ALBUTEROL SULFATE HFA 108 (90 BASE) MCG/ACT IN AERS: 2.0000 | INHALATION_SPRAY | Freq: Four times a day (QID) | RESPIRATORY_TRACT | Status: AC | PRN

## 2013-07-26 NOTE — Telephone Encounter (Signed)
Will send one inhaler but does need to be seen.

## 2013-07-26 NOTE — Telephone Encounter (Signed)
Gary Doyle would like a refill on his inhaler/Albuterol for his Asthma because he has been having night time wheezing. There is not an inhaler on his medication list. He has not been seen for Asthma since 2013. I advised him he needed an appointment. He doesn't see the need for an appointment for a problem he knows he has. I explained to Mr Gary Doyle that a yearly evaluation for chronic Asthma was standard of care. He states he is too busy this week to come in and he might be able to schedule an appointment next week. He will call back next week to set this up. Please advise.

## 2013-07-28 NOTE — Telephone Encounter (Signed)
Patient advised. He states he will call back sometime next week to make an appointment.

## 2013-09-11 ENCOUNTER — Ambulatory Visit (INDEPENDENT_AMBULATORY_CARE_PROVIDER_SITE_OTHER): Payer: 59 | Admitting: Sports Medicine

## 2013-09-11 ENCOUNTER — Ambulatory Visit (INDEPENDENT_AMBULATORY_CARE_PROVIDER_SITE_OTHER): Payer: 59

## 2013-09-11 ENCOUNTER — Encounter: Payer: Self-pay | Admitting: Sports Medicine

## 2013-09-11 VITALS — BP 130/76 | HR 60 | Ht 66.0 in | Wt 166.0 lb

## 2013-09-11 DIAGNOSIS — R091 Pleurisy: Secondary | ICD-10-CM

## 2013-09-11 DIAGNOSIS — R079 Chest pain, unspecified: Secondary | ICD-10-CM

## 2013-09-11 LAB — CBC WITH DIFFERENTIAL/PLATELET
Basophils Absolute: 0 K/uL (ref 0.0–0.1)
Basophils Relative: 0 % (ref 0–1)
Eosinophils Absolute: 0.1 K/uL (ref 0.0–0.7)
Eosinophils Relative: 2 % (ref 0–5)
HCT: 43.3 % (ref 39.0–52.0)
Hemoglobin: 14.9 g/dL (ref 13.0–17.0)
Lymphocytes Relative: 29 % (ref 12–46)
Lymphs Abs: 2 10*3/uL (ref 0.7–4.0)
MCH: 29.5 pg (ref 26.0–34.0)
MCHC: 34.4 g/dL (ref 30.0–36.0)
MCV: 85.8 fL (ref 78.0–100.0)
Monocytes Absolute: 0.5 10*3/uL (ref 0.1–1.0)
Monocytes Relative: 8 % (ref 3–12)
Neutro Abs: 4.1 10*3/uL (ref 1.7–7.7)
Neutrophils Relative %: 61 % (ref 43–77)
Platelets: 189 K/uL (ref 150–400)
RBC: 5.05 MIL/uL (ref 4.22–5.81)
RDW: 13.2 % (ref 11.5–15.5)
WBC: 6.8 10*3/uL (ref 4.0–10.5)

## 2013-09-11 LAB — D-DIMER, QUANTITATIVE: D-Dimer, Quant: 0.27 ug/mL-FEU (ref 0.00–0.48)

## 2013-09-11 MED ORDER — METHYLPREDNISOLONE SODIUM SUCC 125 MG IJ SOLR
125.0000 mg | Freq: Once | INTRAMUSCULAR | Status: AC
Start: 2013-09-11 — End: 2013-09-11
  Administered 2013-09-11: 125 mg via INTRAMUSCULAR

## 2013-09-11 MED ORDER — KETOROLAC TROMETHAMINE 30 MG/ML IJ SOLN
30.0000 mg | Freq: Once | INTRAMUSCULAR | Status: AC
  Administered 2013-09-11: 30 mg via INTRAMUSCULAR

## 2013-09-11 MED ORDER — MELOXICAM 15 MG PO TABS: ORAL_TABLET | ORAL | Status: DC

## 2013-09-11 MED ORDER — PREDNISONE 50 MG PO TABS: ORAL_TABLET | ORAL | Status: DC

## 2013-09-11 NOTE — Progress Notes (Signed)
  Subjective:    CC: Chest pain  HPI: This is a very pleasant 33 year old male with no past medical history, comes in with a one-day history of a sharp pain in the right upper chest, feels deep in the chest, worsens with deep breaths. No changes in physical activity, does not hurt with moving his arms, it does hurt with leaning forward. He denies any fevers, chills however he has had a mild cough, pain is nonexertional, no nausea, diaphoresis, palpitations, no presyncopal symptoms. No constitutional symptoms.  Past medical history, Surgical history, Family history not pertinant except as noted below, Social history, Allergies, and medications have been entered into the medical record, reviewed, and no changes needed.   Review of Systems: No fevers, chills, night sweats, weight loss, chest pain, or shortness of breath.   Objective:    General: Well Developed, well nourished, and in no acute distress.  Neuro: Alert and oriented x3, extra-ocular muscles intact, sensation grossly intact.  HEENT: Normocephalic, atraumatic, pupils equal round reactive to light, neck supple, no masses, no lymphadenopathy, thyroid nonpalpable.  Skin: Warm and dry, no rashes. Cardiac: Regular rate and rhythm, no murmurs rubs or gallops, no lower extremity edema.  Respiratory: Clear to auscultation bilaterally. Not using accessory muscles, speaking in full sentences. No tenderness to palpation on the entirety of the chest wall, ribs, and musculature.  Chest x-ray reviewed and is negative.  Impression and Recommendations:

## 2013-09-11 NOTE — Patient Instructions (Signed)
Pleuritis/Pleurisy Pleurisy is an inflammation and swelling of the lining of the lungs (pleura). Because of this inflammation, it hurts to breathe. It can be aggravated by coughing, laughing, or deep breathing. Pleurisy is often caused by an underlying infection or disease.  HOME CARE INSTRUCTIONS  Monitor your pleurisy for any changes. The following actions may help to alleviate any discomfort you are experiencing:  Medicine may help with pain. Only take over-the-counter or prescription medicines for pain, discomfort, or fever as directed by your health care Gary Doyle.  Only take antibiotic medicine as directed. Make sure to finish it even if you start to feel better. SEEK MEDICAL CARE IF:   Your pain is not controlled with medicine or is increasing.  You have an increase in pus-like (purulent) secretions brought up with coughing. SEEK IMMEDIATE MEDICAL CARE IF:   You have blue or dark lips, fingernails, or toenails.  You are coughing up blood.  You have increased difficulty breathing.  You have continuing pain unrelieved by medicine or pain lasting more than 1 week.  You have pain that radiates into your neck, arms, or jaw.  You develop increased shortness of breath or wheezing.  You develop a fever, rash, vomiting, fainting, or other serious symptoms. MAKE SURE YOU:  Understand these instructions.   Will watch your condition.   Will get help right away if you are not doing well or get worse.  Document Released: 03/02/2005 Document Revised: 11/02/2012 Document Reviewed: 08/14/2012 [facility] Patient Information 2015 ProvidenceExitCare, [place]LL[place]. This information is not intended to replace advice given to you by your health care Gary Doyle. Make sure you discuss any questions you have with your health care Gary Doyle.

## 2013-09-11 NOTE — Assessment & Plan Note (Signed)
Solu-Medrol 125, Toradol 30. We certainly don't want to miss pulmonary embolism, pneumothorax, or other intra-thoracic pathology. D-dimer, chest x-ray. Prednisone taper, Mobic. Return to see me in a week.

## 2014-02-15 ENCOUNTER — Ambulatory Visit (INDEPENDENT_AMBULATORY_CARE_PROVIDER_SITE_OTHER): Payer: 59 | Admitting: Family Medicine

## 2014-02-15 ENCOUNTER — Encounter: Payer: Self-pay | Admitting: Family Medicine

## 2014-02-15 VITALS — BP 106/64 | HR 85 | Temp 98.7°F | Ht 66.0 in | Wt 151.0 lb

## 2014-02-15 DIAGNOSIS — J329 Chronic sinusitis, unspecified: Secondary | ICD-10-CM

## 2014-02-15 DIAGNOSIS — A499 Bacterial infection, unspecified: Secondary | ICD-10-CM

## 2014-02-15 DIAGNOSIS — B9689 Other specified bacterial agents as the cause of diseases classified elsewhere: Secondary | ICD-10-CM

## 2014-02-15 MED ORDER — AMOXICILLIN-POT CLAVULANATE 500-125 MG PO TABS: ORAL_TABLET | ORAL | Status: AC

## 2014-02-15 NOTE — Progress Notes (Signed)
CC: Gary Doyle is a 33 y.o. male is here for sinus pressure and Sore Throat   Subjective: HPI:  Nasal congestion, postnasal drip, pressure between the eyes that radiates into the nose that has been present for the past 1-1/2 weeks. Feels like it's getting worse on a daily basis now moderate in severity present all hours of the day and worse when leaning forward. Had loss of voice 1 week ago but resolved on its own. Interventions have included Mucinex with only mild improvement of the above symptoms. Denies fevers, chills, cough, shortness of breath, wheezing, motor or sensory disturbances nor rashes   Review Of Systems Outlined In HPI  Past Medical History  Diagnosis Date  . Depression   . Asthma   . Allergy     Past Surgical History  Procedure Laterality Date  . Rhinoplasty    . Hernia repair     Family History  Problem Relation Age of Onset  . Depression Sister   . Bipolar disorder      History   Social History  . Marital Status: Divorced    Spouse Name: N/A    Number of Children: N/A  . Years of Education: N/A   Occupational History  . Not on file.   Social History Main Topics  . Smoking status: Former Games developermoker  . Smokeless tobacco: Not on file  . Alcohol Use: 0.5 oz/week    1 drink(s) per week     Comment: per week  . Drug Use: No  . Sexual Activity: Not on file     Comment: lives with parents, divorced X 1, [employer], rides bike 2 X week, [employer]   Other Topics Concern  . Not on file   Social History Narrative     Objective: BP 106/64 mmHg  Pulse 85  Temp(Src) 98.7 F (37.1 C) (Oral)  Ht 5\' 6"  (1.676 m)  Wt 151 lb (68.493 kg)  BMI 24.38 kg/m2  General: Alert and Oriented, No Acute Distress HEENT: Pupils equal, round, reactive to light. Conjunctivae clear.  External ears unremarkable, canals clear with intact TMs with appropriate landmarks.  Middle ear appears open without effusion. Boggy erythematous inferior turbinates with moderate  mucoid discharge.  Moist mucous membranes, pharynx without inflammation nor lesions.  Neck supple without palpable lymphadenopathy nor abnormal masses. Lungs: Clear to auscultation bilaterally, no wheezing/ronchi/rales.  Comfortable work of breathing. Good air movement. Extremities: No peripheral edema.  Strong peripheral pulses.  Mental Status: No depression, anxiety, nor agitation. Skin: Warm and dry.  Assessment & Plan: [name] was seen today for sinus pressure and sore throat.  Diagnoses and associated orders for this visit:  Bacterial sinusitis - amoxicillin-clavulanate (AUGMENTIN) 500-125 MG per tablet; Take one by mouth every 8 hours for ten total days.    Bacterial sinusitis: Start Augmentin consider nasal saline washes and Alka-Seltzer cold and sinus.  Return if symptoms worsen or fail to improve.

## 2014-02-27 ENCOUNTER — Telehealth: Payer: Self-pay

## 2014-02-27 MED ORDER — LEVOFLOXACIN 500 MG PO TABS: 500.0000 mg | ORAL_TABLET | Freq: Every day | ORAL | Status: DC

## 2014-02-27 NOTE — Telephone Encounter (Signed)
Gary Doyle reports he is still having runny nose, chest congestion, cough and fevers last night. He has finished the Augmentin. He did take a cold and sinus OTC with little relief. Please advise.

## 2014-02-27 NOTE — Telephone Encounter (Signed)
Patient advised.

## 2014-02-27 NOTE — Telephone Encounter (Signed)
Levaquin, a stronger antibiotic has been sent to his [facility]

## 2014-06-06 ENCOUNTER — Encounter: Payer: Self-pay | Admitting: Physician Assistant

## 2014-06-06 ENCOUNTER — Ambulatory Visit (INDEPENDENT_AMBULATORY_CARE_PROVIDER_SITE_OTHER): Payer: 59 | Admitting: Physician Assistant

## 2014-06-06 VITALS — BP 133/86 | HR 74 | Ht 66.0 in | Wt 154.0 lb

## 2014-06-06 DIAGNOSIS — G4700 Insomnia, unspecified: Secondary | ICD-10-CM

## 2014-06-06 MED ORDER — TRAZODONE HCL 50 MG PO TABS: 25.0000 mg | ORAL_TABLET | Freq: Every evening | ORAL | Status: AC | PRN

## 2014-06-06 NOTE — Progress Notes (Signed)
   Subjective:    Patient ID: Gary Doyle, male    DOB: 1980/12/01, 34 y.o.   MRN: [id number]  HPI  Patient is a 34 year old male who presents to the clinic with problems going to sleep. He has had a history in the past of some insomnia. He got better for a while. In seems to be worsening over the last month. He occasionally would have nights where he would not go to sleep until well into morning hours. For the last 3 nights he feels like he has trouble significantly in a row with going to sleep. He denies any significant anxiety or triggers for this. Once he does get sleepy sleeps well and wakes up rested. He seems very happy and has no anxiety or depression. He denies any TV use in his room or reason for a problem. He has not tried anything to make better.    Review of Systems  All other systems reviewed and are negative.      Objective:   Physical Exam  Constitutional: He is oriented to person, place, and time. He appears well-developed and well-nourished.  HENT:  Head: Normocephalic and atraumatic.  Cardiovascular: Normal rate, regular rhythm and normal heart sounds.   Pulmonary/Chest: Effort normal and breath sounds normal.  Neurological: He is alert and oriented to person, place, and time.  Skin: Skin is dry.  Psychiatric: He has a normal mood and affect. His behavior is normal.          Assessment & Plan:  Insomnia-discuss good sleep routine and gave handout on things that could be causing him to struggle to go to sleep. I do not think patient is suffering from any significant anxiety. I would like for patient to try melatonin 3-5 mg one hour before bed. He can see if this is helping. I did give him a prescription for trazodone 1/2-1 tab as needed for sleep. Suggested he not start taking nightly but rather as needed. Follow-up in 4-6 weeks to see how sleep is progressing.

## 2014-06-06 NOTE — Patient Instructions (Signed)
Insomnia Insomnia is frequent trouble falling and/or staying asleep. Insomnia can be a long term problem or a short term problem. Both are common. Insomnia can be a short term problem when the wakefulness is related to a certain stress or worry. Long term insomnia is often related to ongoing stress during waking hours and/or poor sleeping habits. Overtime, sleep deprivation itself can make the problem worse. Every little thing feels more severe because you are overtired and your ability to cope is decreased. CAUSES   Stress, anxiety, and depression.  Poor sleeping habits.  Distractions such as TV in the bedroom.  Naps close to bedtime.  Engaging in emotionally charged conversations before bed.  Technical reading before sleep.  Alcohol and other sedatives. They may make the problem worse. They can hurt normal sleep patterns and normal dream activity.  Stimulants such as caffeine for several hours prior to bedtime.  Pain syndromes and shortness of breath can cause insomnia.  Exercise late at night.  Changing time zones may cause sleeping problems (jet lag). It is sometimes helpful to have someone observe your sleeping patterns. They should look for periods of not breathing during the night (sleep apnea). They should also look to see how long those periods last. If you live alone or observers are uncertain, you can also be observed at a sleep clinic where your sleep patterns will be professionally monitored. Sleep apnea requires a checkup and treatment. Give your caregivers your medical history. Give your caregivers observations your family has made about your sleep.  SYMPTOMS   Not feeling rested in the morning.  Anxiety and restlessness at bedtime.  Difficulty falling and staying asleep. TREATMENT   Your caregiver may prescribe treatment for an underlying medical disorders. Your caregiver can give advice or help if you are using alcohol or other drugs for self-medication. Treatment  of underlying problems will usually eliminate insomnia problems.  Medications can be prescribed for short time use. They are generally not recommended for lengthy use.  Over-the-counter sleep medicines are not recommended for lengthy use. They can be habit forming.  You can promote easier sleeping by making lifestyle changes such as:  Using relaxation techniques that help with breathing and reduce muscle tension.  Exercising earlier in the day.  Changing your diet and the time of your last meal. No night time snacks.  Establish a regular time to go to bed.  Counseling can help with stressful problems and worry.  Soothing music and white noise may be helpful if there are background noises you cannot remove.  Stop tedious detailed work at least one hour before bedtime. HOME CARE INSTRUCTIONS   Keep a diary. Inform your caregiver about your progress. This includes any medication side effects. See your caregiver regularly. Take note of:  Times when you are asleep.  Times when you are awake during the night.  The quality of your sleep.  How you feel the next day. This information will help your caregiver care for you.  Get out of bed if you are still awake after 15 minutes. Read or do some quiet activity. Keep the lights down. Wait until you feel sleepy and go back to bed.  Keep regular sleeping and waking hours. Avoid naps.  Exercise regularly.  Avoid distractions at bedtime. Distractions include watching television or engaging in any intense or detailed activity like attempting to balance the household checkbook.  Develop a bedtime ritual. Keep a familiar routine of bathing, brushing your teeth, climbing into bed at the same   time each night, listening to soothing music. Routines increase the success of falling to sleep faster.  Use relaxation techniques. This can be using breathing and muscle tension release routines. It can also include visualizing peaceful scenes. You can  also help control troubling or intruding thoughts by keeping your mind occupied with boring or repetitive thoughts like the old concept of counting sheep. You can make it more creative like imagining planting one beautiful flower after another in your backyard garden.  During your day, work to eliminate stress. When this is not possible use some of the previous suggestions to help reduce the anxiety that accompanies stressful situations. MAKE SURE YOU:   Understand these instructions.  Will watch your condition.  Will get help right away if you are not doing well or get worse. Document Released: 02/28/2000 Document Revised: 05/25/2011 Document Reviewed: 03/30/2007 Exit[employer] Patient Information 2015 [employer]. This information is not intended to replace advice given to you by your health care provider. Make sure you discuss any questions you have with your health care provider.
